# Patient Record
Sex: Female | Born: 1945 | Race: White | Hispanic: No | Marital: Single | Smoking: Never smoker
Health system: Southern US, Community
[De-identification: ages and names within clinical notes are randomized; demographics above are authoritative.]

## PROBLEM LIST (undated history)

## (undated) DIAGNOSIS — F0390 Unspecified dementia without behavioral disturbance: Secondary | ICD-10-CM

## (undated) HISTORY — DX: Unspecified dementia, unspecified severity, without behavioral disturbance, psychotic disturbance, mood disturbance, and anxiety: F03.90

---

## 2014-12-24 ENCOUNTER — Ambulatory Visit: Payer: Medicare Other | Attending: Neurology | Admitting: Speech Pathology

## 2014-12-24 ENCOUNTER — Encounter: Payer: Self-pay | Admitting: Speech Pathology

## 2014-12-24 DIAGNOSIS — R41841 Cognitive communication deficit: Secondary | ICD-10-CM | POA: Insufficient documentation

## 2014-12-24 NOTE — Therapy (Signed)
[facility] [address] [place], [place], [address] Phone: [phone]   Fax:  [phone]  Speech Language Pathology Evaluation  Patient Details  Name: Jeanette Walter MRN: [id number] Date of Birth: 11/04/1945 Referring Provider: Theora MasterZachary Potter, MD  Encounter Date: 12/24/2014      End of Session - 12/24/14 1503    Visit Number 1   Number of Visits 1   Date for SLP Re-Evaluation 12/24/14   SLP Start Time 1100   SLP Stop Time  1200   SLP Time Calculation (min) 60 min   Activity Tolerance Patient tolerated treatment well      Past Medical History  Diagnosis Date  . Dementia     No past surgical history on file.  There were no vitals filed for this visit.  Visit Diagnosis: Cognitive communication deficit - Plan: SLP plan of care cert/re-cert          [facility] 0001    SLP Visit Information   SLP Received On 12/24/14   Referring Provider Theora MasterZachary Potter, MD   Onset Date 12/08/2014   Subjective   Subjective The patient has some word retrieval/language difficulty and says "sometimes it does bother me a little"    General Information   HPI Dementia   Prior Functional Status   Cognitive/Linguistic Baseline Baseline deficits  Diagnosed with early onset dementia several years ago   Oral Motor/Sensory Function   Overall Oral Motor/Sensory Function Appears within functional limits for tasks assessed   Motor Speech   Overall Motor Speech Appears within functional limits for tasks assessed  Slowed rate, fully intelligible   Standardized Assessments   Standardized Assessments  Montreal Cognitive Assessment (MOCA);Western Aphasia Battery revised       Montreal Cognitive Assessment (MOCA)   Version: 7.1   Visuospatial/Executive     Alternating trail making     0/1     Visuoconstruction Skills (copy 3-d design) 1/1     Draw a clock 3/3   Naming 2/3   Attention     Forward digit span  1/1     Backward digit span 0/1     Vigilance 1/1     Serial 7's 2/3   Language     Verbal Fluency 0/1     Repetition 0/2     Abstraction 2/2   Delayed Recall 2/5   Orientation 5/6   TOTAL 19/30    Western Aphasia Battery- Revised Screening   Spontaneous Speech    Information content 9/10    Fluency 9/10   Comprehension     Yes/No questions 9/10      Sequential Commands 8/10   Repetition 8.5/10   Naming     Object Naming 10/10   Aphasia Screening Score 89/100   Reading and Writing     Reading 10/10     Writing 9/10   Language Screening   Score 90.6/100     PATIENT OBSERVATIONS: Patient demonstrated relative strengths such as visuospatial skills, attention, and orientation to the environment, and showed only minor impairment in all areas. She requested use of her own compensatory strategies without prompting, such as bringing out her address and phone number written in her wallet, writing down mathematical figures, rehearsing information to be remembered, and writing with her finger in the air if not permitted a pen. Patient also exhibited effective use of a calendar. Patient's language skills were assessed upon patient complaint of difficulty with word retrieval, but  had a large number of strengths in content, fluency, reading comprehension, reading fluency, object naming, and writing. The patient had good skills in all other areas (auditory verbal comprehension, sequential comands, and repetition) that allowed her to easily complete simple tasks; more abstract tasks involving multiple elements caused her some trouble. The patient demonstrated cognitive-communication skills effective for independent living, especially when allowed to implement her own self-cueing strategies; therefore, the patient is determined to not be in need of cognitive-communication therapy at this current point in time.               SLP Education - 01-13-15 1431    Education Details  Patient encouraged to accept word retrieval problems and to maximize her functional communication by using circumlocution, common knowledge, etc.   Person(s) Educated Patient;Other (comment)   Methods Explanation   Comprehension Verbalized understanding              Plan - January 13, 2015 1504    Clinical Impression Statement Patient is a 69 year old woman under the care of Dr. Malvin Johns, diagnosed with dementia in 2015, is presenting with mild cognitive communication deficits characterized by slowed speech, word finding deficits, memory impairment, and impairment of executive skills.  Per patient and her sister, the patient is managing well and safely in her current setting (independent living in retirement community).  The patient identifies word retrieval problems as frustrating and her sister is concerned that the patient tends to isolate herself socially.  However, the patient is seeking volunteer work at the facility such as helping the Lobbyist.  The patient is functional at this time and does not require speech therapy at this time.   Speech Therapy Frequency One time visit   Treatment/Interventions Other (comment)  evaluation and patient/family education   Potential to Achieve Goals Good   Consulted and Agree with Plan of Care Patient;Family member/caregiver   Family Member Consulted Sister          G-Codes - 13-Jan-2015 1525    Functional Assessment Tool Used MOCA. Western Aphasia Battery- Screenin   Functional Limitations Memory   Memory Current Status [phone]) At least 20 percent but less than 40 percent impaired, limited or restricted   Memory Goal Status (U0454) At least 20 percent but less than 40 percent impaired, limited or restricted   Memory Discharge Status (G9170) At least 20 percent but less than 40 percent impaired, limited or restricted      Problem List There are no active problems to display for this patient.  Jeanette Primrose, MS/CCC- SLP  Leandrew Koyanagi 01/13/15, 3:28 PM  [facility] MAIN [facility] SERVICES [address] [place], [place], [address] Phone: [phone]   Fax:  [phone]  Name: Jeanette Walter MRN: [id number] Date of Birth: 30-Aug-1945

## 2014-12-30 ENCOUNTER — Ambulatory Visit: Payer: Medicare Other | Attending: Neurology

## 2014-12-30 DIAGNOSIS — G4733 Obstructive sleep apnea (adult) (pediatric): Secondary | ICD-10-CM | POA: Diagnosis present

## 2015-01-20 ENCOUNTER — Ambulatory Visit: Payer: Medicare Other | Attending: Neurology

## 2015-01-20 DIAGNOSIS — I10 Essential (primary) hypertension: Secondary | ICD-10-CM | POA: Insufficient documentation

## 2015-01-20 DIAGNOSIS — K219 Gastro-esophageal reflux disease without esophagitis: Secondary | ICD-10-CM | POA: Diagnosis not present

## 2015-01-20 DIAGNOSIS — F519 Sleep disorder not due to a substance or known physiological condition, unspecified: Secondary | ICD-10-CM | POA: Diagnosis not present

## 2015-01-20 DIAGNOSIS — R0683 Snoring: Secondary | ICD-10-CM | POA: Diagnosis not present

## 2015-01-20 DIAGNOSIS — G4733 Obstructive sleep apnea (adult) (pediatric): Secondary | ICD-10-CM | POA: Diagnosis not present

## 2015-01-20 DIAGNOSIS — G4721 Circadian rhythm sleep disorder, delayed sleep phase type: Secondary | ICD-10-CM | POA: Diagnosis not present

## 2015-02-01 ENCOUNTER — Other Ambulatory Visit: Payer: Self-pay | Admitting: Internal Medicine

## 2015-02-01 DIAGNOSIS — Z1231 Encounter for screening mammogram for malignant neoplasm of breast: Secondary | ICD-10-CM

## 2015-02-10 ENCOUNTER — Other Ambulatory Visit: Payer: Self-pay | Admitting: Internal Medicine

## 2015-02-10 ENCOUNTER — Ambulatory Visit
Admission: RE | Admit: 2015-02-10 | Discharge: 2015-02-10 | Disposition: A | Discharge status: Home / Self Care | Payer: Medicare Other | Source: Ambulatory Visit | Attending: Internal Medicine | Admitting: Internal Medicine

## 2015-02-10 DIAGNOSIS — Z1231 Encounter for screening mammogram for malignant neoplasm of breast: Secondary | ICD-10-CM

## 2015-07-10 ENCOUNTER — Other Ambulatory Visit (HOSPITAL_COMMUNITY): Payer: Self-pay | Admitting: Psychiatry

## 2015-08-27 ENCOUNTER — Other Ambulatory Visit (HOSPITAL_COMMUNITY): Payer: Self-pay | Admitting: Psychiatry

## 2015-08-28 ENCOUNTER — Other Ambulatory Visit (HOSPITAL_COMMUNITY): Payer: Self-pay | Admitting: Psychiatry

## 2015-12-06 ENCOUNTER — Other Ambulatory Visit: Payer: Self-pay | Admitting: Otolaryngology

## 2015-12-06 DIAGNOSIS — H9192 Unspecified hearing loss, left ear: Secondary | ICD-10-CM

## 2015-12-07 ENCOUNTER — Telehealth: Payer: Self-pay | Admitting: Otolaryngology

## 2015-12-27 ENCOUNTER — Ambulatory Visit
Admission: RE | Admit: 2015-12-27 | Discharge: 2015-12-27 | Disposition: A | Discharge status: Home / Self Care | Payer: Medicare Other | Source: Ambulatory Visit | Attending: Otolaryngology | Admitting: Otolaryngology

## 2015-12-27 DIAGNOSIS — H9192 Unspecified hearing loss, left ear: Secondary | ICD-10-CM | POA: Diagnosis not present

## 2015-12-27 LAB — POCT I-STAT CREATININE: Creatinine, Ser: 0.8 mg/dL (ref 0.44–1.00)

## 2015-12-27 MED ORDER — GADOBENATE DIMEGLUMINE 529 MG/ML IV SOLN
15.0000 mL | Freq: Once | INTRAVENOUS | Status: AC | PRN
  Administered 2015-12-27: 14 mL via INTRAVENOUS

## 2016-09-28 ENCOUNTER — Other Ambulatory Visit: Payer: Self-pay | Admitting: Orthopedic Surgery

## 2016-09-28 ENCOUNTER — Other Ambulatory Visit: Payer: Self-pay

## 2016-09-28 DIAGNOSIS — Z1231 Encounter for screening mammogram for malignant neoplasm of breast: Secondary | ICD-10-CM

## 2016-10-12 ENCOUNTER — Ambulatory Visit
Admission: RE | Admit: 2016-10-12 | Discharge: 2016-10-12 | Disposition: A | Discharge status: Home / Self Care | Payer: Medicare Other | Source: Ambulatory Visit | Attending: Orthopedic Surgery | Admitting: Orthopedic Surgery

## 2016-10-12 DIAGNOSIS — Z1231 Encounter for screening mammogram for malignant neoplasm of breast: Secondary | ICD-10-CM

## 2017-11-21 ENCOUNTER — Other Ambulatory Visit: Payer: Self-pay | Admitting: Emergency Medicine

## 2017-11-21 DIAGNOSIS — Z1231 Encounter for screening mammogram for malignant neoplasm of breast: Secondary | ICD-10-CM

## 2017-12-18 ENCOUNTER — Ambulatory Visit
Admission: RE | Admit: 2017-12-18 | Discharge: 2017-12-18 | Disposition: A | Discharge status: Home / Self Care | Payer: Medicare Other | Source: Ambulatory Visit | Attending: Emergency Medicine | Admitting: Emergency Medicine

## 2017-12-18 DIAGNOSIS — Z1231 Encounter for screening mammogram for malignant neoplasm of breast: Secondary | ICD-10-CM | POA: Diagnosis not present

## 2018-04-02 ENCOUNTER — Emergency Department: Payer: Medicare Other

## 2018-04-02 ENCOUNTER — Encounter: Payer: Self-pay | Admitting: Emergency Medicine

## 2018-04-02 ENCOUNTER — Other Ambulatory Visit: Payer: Self-pay

## 2018-04-02 ENCOUNTER — Emergency Department
Admission: EM | Admit: 2018-04-02 | Discharge: 2018-04-03 | Disposition: A | Discharge status: Home / Self Care | Payer: Medicare Other | Attending: Emergency Medicine | Admitting: Emergency Medicine

## 2018-04-02 DIAGNOSIS — Z79899 Other long term (current) drug therapy: Secondary | ICD-10-CM | POA: Diagnosis not present

## 2018-04-02 DIAGNOSIS — R05 Cough: Secondary | ICD-10-CM | POA: Diagnosis not present

## 2018-04-02 DIAGNOSIS — R4182 Altered mental status, unspecified: Secondary | ICD-10-CM | POA: Diagnosis present

## 2018-04-02 DIAGNOSIS — Z7982 Long term (current) use of aspirin: Secondary | ICD-10-CM | POA: Diagnosis not present

## 2018-04-02 DIAGNOSIS — F0390 Unspecified dementia without behavioral disturbance: Secondary | ICD-10-CM | POA: Insufficient documentation

## 2018-04-02 DIAGNOSIS — R451 Restlessness and agitation: Secondary | ICD-10-CM | POA: Insufficient documentation

## 2018-04-02 DIAGNOSIS — G3184 Mild cognitive impairment, so stated: Secondary | ICD-10-CM | POA: Diagnosis not present

## 2018-04-02 DIAGNOSIS — R059 Cough, unspecified: Secondary | ICD-10-CM

## 2018-04-02 LAB — COMPREHENSIVE METABOLIC PANEL
ALT: 35 U/L (ref 0–44)
AST: 40 U/L (ref 15–41)
Albumin: 4.1 g/dL (ref 3.5–5.0)
Alkaline Phosphatase: 57 U/L (ref 38–126)
Anion gap: 10 (ref 5–15)
BUN: 19 mg/dL (ref 8–23)
CO2: 25 mmol/L (ref 22–32)
Calcium: 9.7 mg/dL (ref 8.9–10.3)
Chloride: 103 mmol/L (ref 98–111)
Creatinine, Ser: 0.69 mg/dL (ref 0.44–1.00)
GFR calc Af Amer: >60 mL/min (ref >60)
GFR calc non Af Amer: >60 mL/min (ref >60)
Glucose, Bld: 118 mg/dL — ABNORMAL HIGH (ref 70–99)
Potassium: 4.1 mmol/L (ref 3.5–5.1)
SODIUM: 138 mmol/L (ref 135–145)
Total Bilirubin: 0.7 mg/dL (ref 0.3–1.2)
Total Protein: 7.1 g/dL (ref 6.5–8.1)

## 2018-04-02 LAB — URINALYSIS, COMPLETE (UACMP) WITH MICROSCOPIC
Bacteria, UA: NONE SEEN
Bilirubin Urine: NEGATIVE
Glucose, UA: NEGATIVE mg/dL
Hgb urine dipstick: NEGATIVE
KETONES UR: NEGATIVE mg/dL
Nitrite: NEGATIVE
Protein, ur: NEGATIVE mg/dL
Specific Gravity, Urine: 1.02 (ref 1.005–1.030)
pH: 6 (ref 5.0–8.0)

## 2018-04-02 LAB — CBC WITH DIFFERENTIAL/PLATELET
Abs Immature Granulocytes: 0.03 10*3/uL (ref 0.00–0.07)
BASOS ABS: 0.1 10*3/uL (ref 0.0–0.1)
Basophils Relative: 1 %
Eosinophils Absolute: 0.2 10*3/uL (ref 0.0–0.5)
Eosinophils Relative: 2 %
HCT: 38.5 % (ref 36.0–46.0)
Hemoglobin: 13.1 g/dL (ref 12.0–15.0)
IMMATURE GRANULOCYTES: 0 %
Lymphocytes Relative: 15 %
Lymphs Abs: 1.5 10*3/uL (ref 0.7–4.0)
MCH: 29.4 pg (ref 26.0–34.0)
MCHC: 34 g/dL (ref 30.0–36.0)
MCV: 86.5 fL (ref 80.0–100.0)
Monocytes Absolute: 1.3 10*3/uL — ABNORMAL HIGH (ref 0.1–1.0)
Monocytes Relative: 13 %
NEUTROS PCT: 69 %
Neutro Abs: 7.2 10*3/uL (ref 1.7–7.7)
Platelets: 223 10*3/uL (ref 150–400)
RBC: 4.45 MIL/uL (ref 3.87–5.11)
RDW: 13.3 % (ref 11.5–15.5)
WBC: 10.2 10*3/uL (ref 4.0–10.5)
nRBC: 0 % (ref 0.0–0.2)

## 2018-04-02 MED ORDER — ACETAMINOPHEN 500 MG PO TABS
1000.0000 mg | ORAL_TABLET | Freq: Once | ORAL | Status: AC
  Administered 2018-04-02: 1000 mg via ORAL
  Filled 2018-04-02: qty 2

## 2018-04-02 MED ORDER — ATENOLOL 25 MG PO TABS
50.0000 mg | ORAL_TABLET | Freq: Once | ORAL | Status: AC
  Administered 2018-04-02: 50 mg via ORAL
  Filled 2018-04-02: qty 2

## 2018-04-02 MED ORDER — QUETIAPINE FUMARATE 25 MG PO TABS
100.0000 mg | ORAL_TABLET | Freq: Once | ORAL | Status: AC
  Administered 2018-04-02: 100 mg via ORAL
  Filled 2018-04-02: qty 4

## 2018-04-02 MED ORDER — SERTRALINE HCL 50 MG PO TABS
100.0000 mg | ORAL_TABLET | Freq: Once | ORAL | Status: AC
  Administered 2018-04-02: 100 mg via ORAL
  Filled 2018-04-02: qty 2

## 2018-04-02 MED ORDER — AMLODIPINE BESYLATE 5 MG PO TABS
5.0000 mg | ORAL_TABLET | Freq: Once | ORAL | Status: AC
  Administered 2018-04-02: 5 mg via ORAL
  Filled 2018-04-02: qty 1

## 2018-04-02 NOTE — Discharge Instructions (Addendum)
Chest x-ray negative for pneumonia.  Head CT negative.  Urinalysis negative for UTI.  Labs within normal limits.  Patient was given her medications including amlodipine 5 mg, atenolol 50 mg, Seroquel 100 mg, and Zoloft 100 mg prior to transfer back to her independent living facility

## 2018-04-02 NOTE — ED Triage Notes (Signed)
Pt ems from [facility] for AMS. Pt confused on arrival. Refusing care to start more co-operative now

## 2018-04-02 NOTE — ED Notes (Signed)
Attempt to call [facility] at 2 different numbers to secure a ride for patient unsuccessful. Pt states she has no one to call and [facility] can come at 8:30am. Call to EMS

## 2018-04-02 NOTE — ED Notes (Signed)
Patient transported to X-ray 

## 2018-04-02 NOTE — ED Provider Notes (Signed)
[facility] Emergency Department Provider Note  ____________________________________________  Time seen: Approximately 6:08 PM  I have reviewed the triage vital signs and the nursing notes.   HISTORY  Chief Complaint Altered Mental Status  Level 5 caveat:  Portions of the history and physical were unable to be obtained due to dementia and confusion   HPI Jeanette Walter is a 73 y.o. female with a history of dementia and hypertension who presents for evaluation of AMS.  Patient lives in independent living facility.  Today staff went to check on her and she was acting abnormal.  She was very confused, did not know where she was which according to staff is a big change from patient's baseline.  She has had no fever.  Patient is confused at this time and is complaining of a headache.  She will give me any further details on her headache.  She denies chest pain or abdominal pain, nausea, vomiting, diarrhea or dysuria.   Past Medical History:  Diagnosis Date  . Dementia (HCC)   HTN   Prior to Admission medications   Medication Sig Start Date End Date Taking? Authorizing Provider  alendronate (FOSAMAX) 35 MG tablet Take 35 mg by mouth every 7 (seven) days. Take with a full glass of water on an empty stomach.    [provider]  amLODipine (NORVASC) 5 MG tablet Take 5 mg by mouth daily.    [provider]  aspirin 81 MG chewable tablet Chew by mouth daily.    [provider]  atenolol (TENORMIN) 50 MG tablet Take 50 mg by mouth daily.    [provider]  donepezil (ARICEPT ODT) 5 MG disintegrating tablet Take 5 mg by mouth at bedtime.    [provider]  fluticasone (FLONASE) 50 MCG/ACT nasal spray Place into both nostrils daily.    [provider]  loratadine (CLARITIN) 10 MG tablet Take 10 mg by mouth daily.    [provider]  QUEtiapine (SEROQUEL) 200 MG tablet Take 200 mg by mouth at bedtime.     [provider]  sertraline (ZOLOFT) 100 MG tablet Take 100 mg by mouth daily.    [provider]  simvastatin (ZOCOR) 20 MG tablet Take 20 mg by mouth daily.    [provider]    Allergies Septra [sulfamethoxazole-trimethoprim] and Sulfa antibiotics  History reviewed. No pertinent family history.  Social History Social History   Tobacco Use  . Smoking status: Not on file  Substance Use Topics  . Alcohol use: Not on file  . Drug use: Not on file    Review of Systems  Constitutional: Negative for fever. + confusion Cardiovascular: Negative for chest pain. Respiratory: Negative for shortness of breath. Gastrointestinal: Negative for abdominal pain, vomiting or diarrhea. Genitourinary: Negative for dysuria. Neurological: + headaches  ____________________________________________   PHYSICAL EXAM:  VITAL SIGNS: Vitals:   04/02/18 2245 04/02/18 2259  BP:  130/73  Pulse: 76   Resp: 20   Temp:    SpO2: 93%     Constitutional: Alert, disoriented, no distress.  HEENT:      Head: Normocephalic and atraumatic.         Eyes: Conjunctivae are normal. Sclera is non-icteric.       Mouth/Throat: Mucous membranes are moist.       Neck: Supple with no signs of meningismus. Cardiovascular: Regular rate and rhythm. No murmurs, gallops, or rubs. 2+ symmetrical distal pulses are present in all extremities. No JVD.  Respiratory: Normal respiratory effort. Lungs are clear to auscultation bilaterally. No wheezes, crackles, or rhonchi.  Gastrointestinal: Soft, non tender, and non distended with positive bowel sounds. No rebound or guarding. Musculoskeletal: Nontender with normal range of motion in all extremities. No edema, cyanosis, or erythema of extremities. Neurologic: Normal speech and language. Face is symmetric. Moving all extremities. No gross focal neurologic deficits are appreciated. Skin: Skin is warm, dry and intact. No rash noted.   ____________________________________________   LABS (all labs ordered are listed, but only abnormal results are displayed)  Labs Reviewed  CBC WITH DIFFERENTIAL/PLATELET - Abnormal; Notable for the following components:      Result Value   Monocytes Absolute 1.3 (*)    All other components within normal limits  COMPREHENSIVE METABOLIC PANEL - Abnormal; Notable for the following components:   Glucose, Bld 118 (*)    All other components within normal limits  URINALYSIS, COMPLETE (UACMP) WITH MICROSCOPIC - Abnormal; Notable for the following components:   Color, Urine YELLOW (*)    APPearance CLEAR (*)    Leukocytes,Ua TRACE (*)    All other components within normal limits  URINE CULTURE   ____________________________________________  EKG  ED ECG REPORT I, Nita Sickle, the attending physician, personally viewed and interpreted this ECG.  Normal sinus rhythm, rate of 83, normal intervals, normal axis, no ST elevations or depressions.  Normal EKG. ____________________________________________  RADIOLOGY  I have personally reviewed the images performed during this visit and I agree with the Radiologist's read.   Interpretation by Radiologist:  Dg Chest 2 View  Result Date: 04/02/2018 CLINICAL DATA:  Cough EXAM: CHEST - 2 VIEW COMPARISON:  None. FINDINGS: The heart size and mediastinal contours are within normal limits. Both lungs are clear. The visualized skeletal structures are unremarkable. IMPRESSION: No active cardiopulmonary disease. Electronically Signed   By: Deatra Robinson M.D.   On: 04/02/2018 22:10   Ct Head Wo Contrast  Result Date: 04/02/2018 CLINICAL DATA:  Acute onset of confusion. Encephalopathy. EXAM: CT HEAD WITHOUT CONTRAST TECHNIQUE: Contiguous axial images were obtained from the base of the skull through the vertex without intravenous contrast. COMPARISON:  MRI of the brain performed 12/27/2015 FINDINGS: Brain: No evidence of acute infarction, hemorrhage,  hydrocephalus, extra-axial collection or mass lesion / mass effect. Prominence of the sulci suggests mild cortical volume loss. The brainstem and fourth ventricle are within normal limits. The basal ganglia are unremarkable in appearance. The cerebral hemispheres demonstrate grossly normal gray-white differentiation. No mass effect or midline shift is seen. Vascular: No hyperdense vessel or unexpected calcification. Skull: There is no evidence of fracture; visualized osseous structures are unremarkable in appearance. Sinuses/Orbits: The visualized portions of the orbits are within normal limits. The paranasal sinuses and mastoid air cells are well-aerated. Other: No significant soft tissue abnormalities are seen. IMPRESSION: 1. No acute intracranial pathology seen on CT. 2. Mild cortical volume loss noted. Electronically Signed   By: Roanna Raider M.D.   On: 04/02/2018 18:45      ____________________________________________   PROCEDURES  Procedure(s) performed: None Procedures Critical Care performed:  None ____________________________________________   INITIAL IMPRESSION / ASSESSMENT AND PLAN / ED COURSE  73 y.o. female with a history of dementia and hypertension who presents for evaluation of AMS.  Patient is confused but in no distress.  She is otherwise neurologically intact.  Unknown of patient's baseline is however at this time I think she is too confused for somebody who lives in independent living.  She is complaining  of a headache.  She is not on any blood thinners.  No known trauma.  We will send patient for head CT to rule out intracranial hemorrhage.  Will check labs to rule out dehydration, anemia, electrolyte abnormalities, AKI, or UTI.  Will give her Tylenol for her headache.  Will attempt to contact patient's family    _________________________ 11:13 PM on 04/02/2018 -----------------------------------------  Patient is labs, urinalysis, EKG, head CT and chest x-ray showing no  acute findings.  I spoke with patient's sister Jeanette Walter who was able to speak with the patient on the phone and she tells me that patient seems to be currently at baseline.  Patient was noted to have a cough however there is no evidence of pneumonia with a clear chest x-ray, no white count and no fever.  Patient sister called the facility and she was concerned the patient would not be able to get her evening medications since the nurse who administers those had left.  I reassured her that we would give patient her evening medications.  She confirmed the medications on the phone with me.  Patient received her evening doses.  Patient's mental status has improved significantly in the ED, she is able to provide more history at this time, she is pleasant and cooperative.  She reports resolution of her headache after Tylenol was given.  At this time she is stable for discharge back to the facility.  Sister has spoken with the facility who know patient is returning.    As part of my medical decision making, I reviewed the following data within the electronic MEDICAL RECORD NUMBER History obtained from family, Nursing notes reviewed and incorporated, Labs reviewed , EKG interpreted , Old EKG reviewed, Old chart reviewed, Radiograph reviewed , Notes from prior ED visits and [place] Controlled Substance Database    Pertinent labs & imaging results that were available during my care of the patient were reviewed by me and considered in my medical decision making (see chart for details).    ____________________________________________   FINAL CLINICAL IMPRESSION(S) / ED DIAGNOSES  Final diagnoses:  Altered mental status, unspecified altered mental status type  Cough      NEW MEDICATIONS STARTED DURING THIS VISIT:  ED Discharge Orders    None       Note:  This document was prepared using Dragon voice recognition software and may include unintentional dictation errors.    Don Perking, [place], MD  04/02/18 [phone]

## 2018-04-03 ENCOUNTER — Encounter: Payer: Self-pay | Admitting: *Deleted

## 2018-04-03 ENCOUNTER — Other Ambulatory Visit: Payer: Self-pay

## 2018-04-03 ENCOUNTER — Emergency Department (EMERGENCY_DEPARTMENT_HOSPITAL)
Admission: EM | Admit: 2018-04-03 | Discharge: 2018-04-03 | Disposition: A | Discharge status: Home / Self Care | Payer: Medicare Other | Source: Home / Self Care | Attending: Student in an Organized Health Care Education/Training Program | Admitting: Student in an Organized Health Care Education/Training Program

## 2018-04-03 DIAGNOSIS — R4182 Altered mental status, unspecified: Secondary | ICD-10-CM | POA: Diagnosis present

## 2018-04-03 DIAGNOSIS — Z79899 Other long term (current) drug therapy: Secondary | ICD-10-CM | POA: Insufficient documentation

## 2018-04-03 DIAGNOSIS — G3184 Mild cognitive impairment, so stated: Secondary | ICD-10-CM

## 2018-04-03 DIAGNOSIS — R451 Restlessness and agitation: Secondary | ICD-10-CM | POA: Insufficient documentation

## 2018-04-03 DIAGNOSIS — F0391 Unspecified dementia with behavioral disturbance: Secondary | ICD-10-CM

## 2018-04-03 DIAGNOSIS — Z7982 Long term (current) use of aspirin: Secondary | ICD-10-CM | POA: Insufficient documentation

## 2018-04-03 MED ORDER — HALOPERIDOL LACTATE 5 MG/ML IJ SOLN
5.0000 mg | Freq: Once | INTRAMUSCULAR | Status: AC
  Administered 2018-04-03: 5 mg via INTRAMUSCULAR
  Filled 2018-04-03: qty 1

## 2018-04-03 MED ORDER — QUETIAPINE FUMARATE 200 MG PO TABS: 200.0000 mg | ORAL_TABLET | Freq: Every day | ORAL | Status: DC

## 2018-04-03 NOTE — ED Notes (Signed)
TTS  Was unable to engage patient to participate in the assessment.  Patient was unable to answer basic questions at the time of the encounter. Patient was altered and could not provide reliable information. No collateral information was available at this time.

## 2018-04-03 NOTE — Consult Note (Signed)
[facility] Face-to-Face Psychiatry Consult   Reason for Consult: Altered Mental Status Referring Physician: Dr. Scotty Court Patient Identification: Jeanette Walter MRN:  [id number] Principal Diagnosis: Altered mental status, unspecified Diagnosis:  Principal Problem:   Altered mental status, unspecified   Total Time spent with patient: 1 hour  Subjective:  "I'm ready to go home."  Jeanette Walter is a 73 y.o. female patient presented to [facility] ED via EMS. The patient was seen face-to-face by overnight psychiatric provider; chart reviewed. Patient is alert to self.  This provider is unable to engage in the assessment process. During the encounter the patient is unable to answer basic questions. No collateral information was available at that time.   HPI: per Dr. Roxan Hockey; HPI  Jeanette Walter is a 73 y.o. female with advanced dementia presents the ER after recent discharge for worsening dementia agitation and combativeness with EMS professionals. They state that she is currently living in a independent living facility and did not feel comfortable leaving her there.   On reevaluation, patient is calm and cooperative.  She has been sleeping the majority of the morning per nursing report.  She has had no behavioral issues. Collateral was obtained by Robbie Lis (see transition of care note) and interview with the patient's sister Jeanette Walter, also patient's POA.  Jeanette Walter reports that patient has been living independently.  She is able to visit with her sister at least twice a week, and sometimes also spends the night.   Patient has been evaluated by physical therapy and has physical therapy services in place at home.  Due to caregiver burnout, patient would also benefit from social work support in home.  On interview with patient, she is able to discuss coming to the hospital last night for a cough, and then getting agitated when she was returning home "because I was tired."  Patient is currently denying any SI, HI, AVH.   Patient would like to return to her home.  She is agreeable to elaborating services to be brought in.  Sister is agreeable to continuing to maintain patient safety, with support programs put in place by social work from emergency department.   Past Psychiatric History: None given  Risk to Self:  No Risk to Others:  No Prior Inpatient Therapy:  Yes, greater than 10 years ago Prior Outpatient Therapy:  No  Past Medical History:  Past Medical History:  Diagnosis Date  . Dementia (HCC)    History reviewed. No pertinent surgical history. Family History: No family history on file. Family Psychiatric  History: Patient's mother had a progressive dementia similar to what Theres is currently experiencing.  Social History:  Social History   Substance and Sexual Activity  Alcohol Use Not on file     Social History   Substance and Sexual Activity  Drug Use Not on file    Social History   Socioeconomic History  . Marital status: Unknown    Spouse name: Not on file  . Number of children: Not on file  . Years of education: Not on file  . Highest education level: Not on file  Occupational History  . Not on file  Social Needs  . Financial resource strain: Not on file  . Food insecurity:    Worry: Not on file    Inability: Not on file  . Transportation needs:    Medical: Not on file    Non-medical: Not on file  Tobacco Use  . Smoking status: Never Smoker  . Smokeless tobacco: Never Used  Substance and Sexual Activity  . Alcohol use: Not on file  . Drug use: Not on file  . Sexual activity: Not on file  Lifestyle  . Physical activity:    Days per week: Not on file    Minutes per session: Not on file  . Stress: Not on file  Relationships  . Social connections:    Talks on phone: Not on file    Gets together: Not on file    Attends religious service: Not on file    Active member of club or organization: Not on file    Attends meetings of clubs or organizations: Not on file     Relationship status: Not on file  Other Topics Concern  . Not on file  Social History Narrative  . Not on file   Additional Social History:  Living in independent living apartment  Allergies:   Allergies  Allergen Reactions  . Septra [Sulfamethoxazole-Trimethoprim]   . Sulfa Antibiotics     Labs:  Results for orders placed or performed during the hospital encounter of 04/02/18 (from the past 48 hour(s))  CBC with Differential/Platelet     Status: Abnormal   Collection Time: 04/02/18  6:51 PM  Result Value Ref Range   WBC 10.2 4.0 - 10.5 K/uL   RBC 4.45 3.87 - 5.11 MIL/uL   Hemoglobin 13.1 12.0 - 15.0 g/dL   HCT 40.9 81.1 - 91.4 %   MCV 86.5 80.0 - 100.0 fL   MCH 29.4 26.0 - 34.0 pg   MCHC 34.0 30.0 - 36.0 g/dL   RDW 78.2 95.6 - 21.3 %   Platelets 223 150 - 400 K/uL   nRBC 0.0 0.0 - 0.2 %   Neutrophils Relative % 69 %   Neutro Abs 7.2 1.7 - 7.7 K/uL   Lymphocytes Relative 15 %   Lymphs Abs 1.5 0.7 - 4.0 K/uL   Monocytes Relative 13 %   Monocytes Absolute 1.3 (H) 0.1 - 1.0 K/uL   Eosinophils Relative 2 %   Eosinophils Absolute 0.2 0.0 - 0.5 K/uL   Basophils Relative 1 %   Basophils Absolute 0.1 0.0 - 0.1 K/uL   Immature Granulocytes 0 %   Abs Immature Granulocytes 0.03 0.00 - 0.07 K/uL    Comment: Performed at [facility], [address] [place], [place] [address]  Comprehensive metabolic panel     Status: Abnormal   Collection Time: 04/02/18  6:51 PM  Result Value Ref Range   Sodium 138 135 - 145 mmol/L   Potassium 4.1 3.5 - 5.1 mmol/L   Chloride 103 98 - 111 mmol/L   CO2 25 22 - 32 mmol/L   Glucose, Bld 118 (H) 70 - 99 mg/dL   BUN 19 8 - 23 mg/dL   Creatinine, Ser 8.46 0.44 - 1.00 mg/dL   Calcium 9.7 8.9 - 96.2 mg/dL   Total Protein 7.1 6.5 - 8.1 g/dL   Albumin 4.1 3.5 - 5.0 g/dL   AST 40 15 - 41 U/L   ALT 35 0 - 44 U/L   Alkaline Phosphatase 57 38 - 126 U/L   Total Bilirubin 0.7 0.3 - 1.2 mg/dL   GFR calc non Af Amer >60 >60 mL/min   GFR  calc Af Amer >60 >60 mL/min   Anion gap 10 5 - 15    Comment: Performed at [facility], [address] [place], [place] [address]  Urinalysis, Complete w Microscopic     Status: Abnormal   Collection  Time: 04/02/18  8:12 PM  Result Value Ref Range   Color, Urine YELLOW (A) YELLOW   APPearance CLEAR (A) CLEAR   Specific Gravity, Urine 1.020 1.005 - 1.030   pH 6.0 5.0 - 8.0   Glucose, UA NEGATIVE NEGATIVE mg/dL   Hgb urine dipstick NEGATIVE NEGATIVE   Bilirubin Urine NEGATIVE NEGATIVE   Ketones, ur NEGATIVE NEGATIVE mg/dL   Protein, ur NEGATIVE NEGATIVE mg/dL   Nitrite NEGATIVE NEGATIVE   Leukocytes,Ua TRACE (A) NEGATIVE   RBC / HPF 0-5 0 - 5 RBC/hpf   WBC, UA 11-20 0 - 5 WBC/hpf   Bacteria, UA NONE SEEN NONE SEEN   Squamous Epithelial / LPF 0-5 0 - 5   Mucus PRESENT     Comment: Performed at [facility], [address] [place], [place] [address]    Current Facility-Administered Medications  Medication Dose Route Frequency Provider Last Rate Last Dose  . QUEtiapine (SEROQUEL) tablet 200 mg  200 mg Oral QHS Willy Eddy, MD       Current Outpatient Medications  Medication Sig Dispense Refill  . alendronate (FOSAMAX) 35 MG tablet Take 35 mg by mouth every 7 (seven) days. Take with a full glass of water on an empty stomach.    Marland Kitchen amLODipine (NORVASC) 5 MG tablet Take 5 mg by mouth daily.    Marland Kitchen aspirin 81 MG chewable tablet Chew by mouth daily.    Marland Kitchen atenolol (TENORMIN) 50 MG tablet Take 50 mg by mouth daily.    Marland Kitchen donepezil (ARICEPT ODT) 5 MG disintegrating tablet Take 5 mg by mouth at bedtime.    . fluticasone (FLONASE) 50 MCG/ACT nasal spray Place into both nostrils daily.    Marland Kitchen loratadine (CLARITIN) 10 MG tablet Take 10 mg by mouth daily.    . QUEtiapine (SEROQUEL) 200 MG tablet Take 200 mg by mouth at bedtime.    . sertraline (ZOLOFT) 100 MG tablet Take 100 mg by mouth daily.    . simvastatin (ZOCOR) 20 MG tablet Take 20 mg by mouth daily.       Musculoskeletal: Strength & Muscle Tone: decreased Gait & Station: unsteady Patient leans: Backward  Psychiatric Specialty Exam: Physical Exam  Nursing note and vitals reviewed. Constitutional: She appears well-developed and well-nourished.  HENT:  Head: Normocephalic and atraumatic.  Eyes: Pupils are equal, round, and reactive to light. Conjunctivae and EOM are normal.  Neck: Normal range of motion. Neck supple.  Cardiovascular: Normal rate and regular rhythm.  Respiratory: Effort normal and breath sounds normal.  Musculoskeletal: Normal range of motion.  Neurological: She is alert.  Skin: Skin is warm and dry.  Psychiatric: Her behavior is normal.    Review of Systems  Neurological: Positive for tremors and sensory change. Negative for loss of consciousness.  Psychiatric/Behavioral: Positive for memory loss. Negative for depression, hallucinations, substance abuse and suicidal ideas. The patient is not nervous/anxious and does not have insomnia.   All other systems reviewed and are negative.   Blood pressure 132/74, pulse 72, temperature 98.2 F (36.8 C), resp. rate 14, height 5\' 3"  (1.6 m), weight 70.3 kg, SpO2 94 %.Body mass index is 27.45 kg/m.  General Appearance: Fairly Groomed  Eye Contact:  Good  Speech:  Clear and Coherent  Volume:  Decreased  Mood:  Euthymic  Affect:  Flat  Thought Process:  Descriptions of Associations: Circumstantial  Orientation:  Other:  Alert and oriented to self and place  Thought Content:  Logical and Hallucinations: None  Suicidal Thoughts:  No  Homicidal Thoughts:  No  Memory:  impaired  Judgement:  Fair  Insight:  Shallow  Psychomotor Activity:  Normal  Concentration:  Concentration: Fair  Recall:  [employer] of Knowledge:  Fair  Language:  Fair  Akathisia:  No  Handed:  Right  AIMS (if indicated):     Assets:  Housing Social Support  ADL's:  Intact  Cognition:  Impaired,  Mild  Sleep:   adequate     Treatment Plan  Summary: Resume home medications and follow-up with primary care provider for dementia treatment.  Would not recommend antipsychotics at this time, as agitated behavior has not been a chronic problem, and antipsychotics have blackbox warnings in the elderly.  Disposition: No evidence of imminent risk to self or others at present.   Patient does not meet criteria for psychiatric inpatient admission. Supportive therapy provided about ongoing stressors. Discussed crisis plan, support from social network, calling 911, coming to the Emergency Department, and calling Suicide Hotline.   She was able to engage in safety planning including plan to return to nearest emergency room or contact emergency services if she feels unable to maintain her own safety or the safety of others. Patient had no further questions, comments, or concerns.  Discharge into care of sister, who agrees to maintain patient safety.  Mariel Craft, MD

## 2018-04-03 NOTE — ED Notes (Signed)
Pt assisted to the bathroom in hall with RN Connye Burkitt

## 2018-04-03 NOTE — ED Notes (Signed)
Case manger and psychiatrist make plan for pt to be discharged with home health care. Sister was updated by case manger on this plan

## 2018-04-03 NOTE — ED Provider Notes (Signed)
[facility] Emergency Department Provider Note    First MD Initiated Contact with Patient 04/03/18 0107     (approximate)  I have reviewed the triage vital signs and the nursing notes.   HISTORY  Chief Complaint Altered Mental Status  Level V Caveat:  dementia  HPI Jeanette Walter is a 73 y.o. female with advanced dementia presents the ER after recent discharge for worsening dementia agitation and combativeness with EMS professionals.  They state that she is currently living in a independent living facility and did not feel comfortable leaving her there.     Past Medical History:  Diagnosis Date  . Dementia (HCC)    No family history on file. History reviewed. No pertinent surgical history. There are no active problems to display for this patient.     Prior to Admission medications   Medication Sig Start Date End Date Taking? Authorizing Provider  alendronate (FOSAMAX) 35 MG tablet Take 35 mg by mouth every 7 (seven) days. Take with a full glass of water on an empty stomach.    [provider]  amLODipine (NORVASC) 5 MG tablet Take 5 mg by mouth daily.    [provider]  aspirin 81 MG chewable tablet Chew by mouth daily.    [provider]  atenolol (TENORMIN) 50 MG tablet Take 50 mg by mouth daily.    [provider]  donepezil (ARICEPT ODT) 5 MG disintegrating tablet Take 5 mg by mouth at bedtime.    [provider]  fluticasone (FLONASE) 50 MCG/ACT nasal spray Place into both nostrils daily.    [provider]  loratadine (CLARITIN) 10 MG tablet Take 10 mg by mouth daily.    [provider]  QUEtiapine (SEROQUEL) 200 MG tablet Take 200 mg by mouth at bedtime.    [provider]  sertraline (ZOLOFT) 100 MG tablet Take 100 mg by mouth daily.    [provider]  simvastatin (ZOCOR) 20 MG tablet Take 20 mg by mouth daily.    [provider]    Allergies Septra  [sulfamethoxazole-trimethoprim] and Sulfa antibiotics    Social History Social History   Tobacco Use  . Smoking status: Never Smoker  . Smokeless tobacco: Never Used  Substance Use Topics  . Alcohol use: Not on file  . Drug use: Not on file    Review of Systems Patient denies headaches, rhinorrhea, blurry vision, numbness, shortness of breath, chest pain, edema, cough, abdominal pain, nausea, vomiting, diarrhea, dysuria, fevers, rashes or hallucinations unless otherwise stated above in HPI. ____________________________________________   PHYSICAL EXAM:  VITAL SIGNS: Vitals:   04/03/18 0059 04/03/18 0457  BP: (!) 148/88 132/74  Pulse: 84 72  Resp: 16 14  Temp: 98.2 F (36.8 C)   SpO2: 94% 94%    Constitutional: Alert, but disoriented and combative with staff. Eyes: Conjunctivae are normal.  Head: Atraumatic. Nose: No congestion/rhinnorhea. Mouth/Throat: Mucous membranes are moist.   Neck: No stridor. Painless ROM.  Cardiovascular: Normal rate, regular rhythm. Grossly normal heart sounds.  Good peripheral circulation. Respiratory: Normal respiratory effort.  No retractions. Lungs CTAB. Gastrointestinal: Soft and nontender. No distention. No abdominal bruits. No CVA tenderness. Genitourinary:  Musculoskeletal: No lower extremity tenderness nor edema.  No joint effusions. Neurologic:  Normal speech and language. No gross focal neurologic deficits are appreciated. No facial droop Skin:  Skin is warm, dry and intact. No rash noted. Psychiatric: Agitated and appears intermittently paranoid. ____________________________________________   LABS (all labs ordered  are listed, but only abnormal results are displayed)  Results for orders placed or performed during the hospital encounter of 04/02/18 (from the past 24 hour(s))  CBC with Differential/Platelet     Status: Abnormal   Collection Time: 04/02/18  6:51 PM  Result Value Ref Range   WBC 10.2 4.0 - 10.5 K/uL   RBC 4.45  3.87 - 5.11 MIL/uL   Hemoglobin 13.1 12.0 - 15.0 g/dL   HCT 28.4 13.2 - 44.0 %   MCV 86.5 80.0 - 100.0 fL   MCH 29.4 26.0 - 34.0 pg   MCHC 34.0 30.0 - 36.0 g/dL   RDW 10.2 72.5 - 36.6 %   Platelets 223 150 - 400 K/uL   nRBC 0.0 0.0 - 0.2 %   Neutrophils Relative % 69 %   Neutro Abs 7.2 1.7 - 7.7 K/uL   Lymphocytes Relative 15 %   Lymphs Abs 1.5 0.7 - 4.0 K/uL   Monocytes Relative 13 %   Monocytes Absolute 1.3 (H) 0.1 - 1.0 K/uL   Eosinophils Relative 2 %   Eosinophils Absolute 0.2 0.0 - 0.5 K/uL   Basophils Relative 1 %   Basophils Absolute 0.1 0.0 - 0.1 K/uL   Immature Granulocytes 0 %   Abs Immature Granulocytes 0.03 0.00 - 0.07 K/uL  Comprehensive metabolic panel     Status: Abnormal   Collection Time: 04/02/18  6:51 PM  Result Value Ref Range   Sodium 138 135 - 145 mmol/L   Potassium 4.1 3.5 - 5.1 mmol/L   Chloride 103 98 - 111 mmol/L   CO2 25 22 - 32 mmol/L   Glucose, Bld 118 (H) 70 - 99 mg/dL   BUN 19 8 - 23 mg/dL   Creatinine, Ser 4.40 0.44 - 1.00 mg/dL   Calcium 9.7 8.9 - 34.7 mg/dL   Total Protein 7.1 6.5 - 8.1 g/dL   Albumin 4.1 3.5 - 5.0 g/dL   AST 40 15 - 41 U/L   ALT 35 0 - 44 U/L   Alkaline Phosphatase 57 38 - 126 U/L   Total Bilirubin 0.7 0.3 - 1.2 mg/dL   GFR calc non Af Amer >60 >60 mL/min   GFR calc Af Amer >60 >60 mL/min   Anion gap 10 5 - 15  Urinalysis, Complete w Microscopic     Status: Abnormal   Collection Time: 04/02/18  8:12 PM  Result Value Ref Range   Color, Urine YELLOW (A) YELLOW   APPearance CLEAR (A) CLEAR   Specific Gravity, Urine 1.020 1.005 - 1.030   pH 6.0 5.0 - 8.0   Glucose, UA NEGATIVE NEGATIVE mg/dL   Hgb urine dipstick NEGATIVE NEGATIVE   Bilirubin Urine NEGATIVE NEGATIVE   Ketones, ur NEGATIVE NEGATIVE mg/dL   Protein, ur NEGATIVE NEGATIVE mg/dL   Nitrite NEGATIVE NEGATIVE   Leukocytes,Ua TRACE (A) NEGATIVE   RBC / HPF 0-5 0 - 5 RBC/hpf   WBC, UA 11-20 0 - 5 WBC/hpf   Bacteria, UA NONE SEEN NONE SEEN   Squamous  Epithelial / LPF 0-5 0 - 5   Mucus PRESENT    ____________________________________________ ____________________________________________   PROCEDURES  Procedure(s) performed:  Procedures    Critical Care performed: no ____________________________________________   INITIAL IMPRESSION / ASSESSMENT AND PLAN / ED COURSE  Pertinent labs & imaging results that were available during my care of the patient were reviewed by me and considered in my medical decision making (see chart for details).   DDX: dementia, medication noncompliance, sundowning  Jeanette Walter is a 73 y.o. who presents to the ED with symptoms as described above.  Due to her agitation and mild behavior towards staff will give calming agent in the form of IM Haldol.  Patient had extensive work-up in the ER prior to discharge therefore do not feel this needs to be repeated.  Will consult psychiatry.      As part of my medical decision making, I reviewed the following data within the electronic MEDICAL RECORD NUMBER Nursing notes reviewed and incorporated, Labs reviewed, notes from prior ED visits and  Controlled Substance Database   ____________________________________________   FINAL CLINICAL IMPRESSION(S) / ED DIAGNOSES  Final diagnoses:  Agitation  Dementia with behavioral disturbance, unspecified dementia type (HCC)      NEW MEDICATIONS STARTED DURING THIS VISIT:  New Prescriptions   No medications on file     Note:  This document was prepared using Dragon voice recognition software and may include unintentional dictation errors.    Willy Eddy, MD 04/03/18 [phone]

## 2018-04-03 NOTE — TOC Initial Note (Signed)
Transition of Care [facility]) - Initial/Assessment Note    Patient Details  Name: Jeanette Walter MRN: [id number] Date of Birth: [birth year]  Transition [facility]) CM/SW Contact:    Allayne Butcher, RN Phone Number: 04/03/2018, 12:42 PM  Clinical Narrative:                 [facility].  Patient has a history of dementia and patient's sister [name] reports that she has been admitted to a psych unit twice in the past last time 10 years ago.  Myra is the patient's only family and she is overwhelmed with the caretaker responsibility, it is starting to become financially draining.  Home health offered if patient is cleared for discharge and Myra is in agreement with this, she would like to use [facility].  Mellissa Kohut with [facility] aware of possible referral.    Expected Discharge Plan: Home/Self Care([facility] Independent living) Barriers to Discharge: Other (comment)(psych consult)   Patient Goals and CMS Choice Patient states their goals for this hospitalization and ongoing recovery are:: Sister Myra just says she needs help- she is okay with going back to [facility] with home health services. CMS Medicare.gov Compare Post Acute Care list provided to:: Patient Represenative (must comment)(POA Myra pt's sister) Choice offered to / list presented to : Sibling  Expected Discharge Plan and Services Expected Discharge Plan: Home/Self Care([facility] Independent living) Discharge Planning Services: CM Consult   Living arrangements for the past 2 months: Independent Living Facility                          Prior Living Arrangements/Services Living arrangements for the past 2 months: Independent Living Facility Lives with:: Self   Do you feel safe going back to the place where you live?: Yes      Need for Family Participation in Patient Care: Yes (Comment) Care giver support system in place?: Yes (comment) Current home services: Home PT, Homehealth  aide Criminal Activity/Legal Involvement Pertinent to Current Situation/Hospitalization: No - Comment as needed  Activities of Daily Living      Permission Sought/Granted Permission sought to share information with : Other (comment), Case Manager(Home health agency) Permission granted to share information with : Yes, Verbal Permission Granted     Permission granted to share info w AGENCY: [facility]        Emotional Assessment Appearance:: Appears stated age, Well-Groomed Attitude/Demeanor/Rapport: Engaged Affect (typically observed): Accepting, Calm, Appropriate Orientation: : Oriented to Self, Oriented to Place, Oriented to Situation, Oriented to  Time Alcohol / Substance Use: Not Applicable Psych Involvement: Yes (comment)  Admission diagnosis:  altered mental status Patient Active Problem List   Diagnosis Date Noted  . Altered mental status, unspecified 04/03/2018   PCP:  Patient, No Pcp Per Pharmacy:   [id number] [place], [place] - [address] AT [facility] [address] [place] [address] Phone: [phone] Fax: [phone]     Social Determinants of Health (SDOH) Interventions    Readmission Risk Interventions  No flowsheet data found.

## 2018-04-03 NOTE — TOC Transition Note (Signed)
Transition of Care [facility]) - CM/SW Discharge Note   Patient Details  Name: Cassee Thornes MRN: [id number] Date of Birth: 02-14-1945  Transition of Care [facility]) CM/SW Contact:  Allayne Butcher, RN Phone Number: 04/03/2018, 1:08 PM   Clinical Narrative:    Patient will discharge back to [facility] with home health services from [facility].  Sister [name] will pick patient up and transport back to [facility].   Final next level of care: Home w Home Health Services Barriers to Discharge: Barriers Resolved   Patient Goals and CMS Choice Patient states their goals for this hospitalization and ongoing recovery are:: Sister Myra just says she needs help- she is okay with going back to [facility] with home health services. CMS Medicare.gov Compare Post Acute Care list provided to:: Patient Represenative (must comment)(POA Myra pt's sister) Choice offered to / list presented to : Sibling  Discharge Placement                       Discharge Plan and Services Discharge Planning Services: CM Consult                      Social Determinants of Health (SDOH) Interventions     Readmission Risk Interventions No flowsheet data found.

## 2018-04-03 NOTE — ED Provider Notes (Signed)
-----------------------------------------   3:07 PM on 04/03/2018 ----------------------------------------- Remains calm in the ED.  Evaluated by the psychiatry.  They recommend against antipsychotics at this time, no medication changes.  Patient being taken care of by sister.  Arrange for home health services.   Sharman Cheek, MD 04/03/18 [phone]

## 2018-04-03 NOTE — ED Notes (Signed)
Sister; Avon Gully made aware of discharge plan and states she will provide discharge transportation.

## 2018-04-03 NOTE — ED Notes (Signed)
Pt asking if she can go back to her apartment. Explained she needs to have breakfast and see the social worker and from there develop a plan. States she understands but doesn't appear happy or content with the answer

## 2018-04-03 NOTE — ED Triage Notes (Signed)
EMS arrived to bring pt home and when they left she became increasing confused and swinging and kicking at Paramedics. They felt unsafe leaving pt at apartment alone and returned here. Pt acting out and not wanting to move to stretcher. VSS

## 2018-04-04 LAB — URINE CULTURE: Culture: 40000 — AB

## 2018-04-15 ENCOUNTER — Other Ambulatory Visit: Payer: Self-pay

## 2018-04-15 ENCOUNTER — Encounter: Payer: Self-pay | Admitting: Emergency Medicine

## 2018-04-15 ENCOUNTER — Emergency Department
Admission: EM | Admit: 2018-04-15 | Discharge: 2018-06-11 | Disposition: A | Discharge status: Home / Self Care | Payer: Medicare Other | Attending: Emergency Medicine | Admitting: Emergency Medicine

## 2018-04-15 DIAGNOSIS — R2681 Unsteadiness on feet: Secondary | ICD-10-CM | POA: Insufficient documentation

## 2018-04-15 DIAGNOSIS — R410 Disorientation, unspecified: Secondary | ICD-10-CM | POA: Diagnosis not present

## 2018-04-15 DIAGNOSIS — Z20828 Contact with and (suspected) exposure to other viral communicable diseases: Secondary | ICD-10-CM | POA: Diagnosis not present

## 2018-04-15 DIAGNOSIS — F03918 Unspecified dementia, unspecified severity, with other behavioral disturbance: Secondary | ICD-10-CM | POA: Diagnosis present

## 2018-04-15 DIAGNOSIS — F0391 Unspecified dementia with behavioral disturbance: Secondary | ICD-10-CM | POA: Insufficient documentation

## 2018-04-15 DIAGNOSIS — R0602 Shortness of breath: Secondary | ICD-10-CM

## 2018-04-15 DIAGNOSIS — R4182 Altered mental status, unspecified: Secondary | ICD-10-CM | POA: Diagnosis present

## 2018-04-15 DIAGNOSIS — Z7982 Long term (current) use of aspirin: Secondary | ICD-10-CM | POA: Insufficient documentation

## 2018-04-15 DIAGNOSIS — Z046 Encounter for general psychiatric examination, requested by authority: Secondary | ICD-10-CM | POA: Diagnosis present

## 2018-04-15 DIAGNOSIS — Z79899 Other long term (current) drug therapy: Secondary | ICD-10-CM | POA: Diagnosis not present

## 2018-04-15 DIAGNOSIS — F329 Major depressive disorder, single episode, unspecified: Secondary | ICD-10-CM | POA: Insufficient documentation

## 2018-04-15 DIAGNOSIS — R404 Transient alteration of awareness: Secondary | ICD-10-CM | POA: Diagnosis not present

## 2018-04-15 LAB — CBC
HCT: 42.5 % (ref 36.0–46.0)
Hemoglobin: 14.2 g/dL (ref 12.0–15.0)
MCH: 29.2 pg (ref 26.0–34.0)
MCHC: 33.4 g/dL (ref 30.0–36.0)
MCV: 87.3 fL (ref 80.0–100.0)
Platelets: 296 10*3/uL (ref 150–400)
RBC: 4.87 MIL/uL (ref 3.87–5.11)
RDW: 13.4 % (ref 11.5–15.5)
WBC: 10.5 10*3/uL (ref 4.0–10.5)
nRBC: 0 % (ref 0.0–0.2)

## 2018-04-15 LAB — COMPREHENSIVE METABOLIC PANEL
ALBUMIN: 4.4 g/dL (ref 3.5–5.0)
ALT: 43 U/L (ref 0–44)
AST: 51 U/L — ABNORMAL HIGH (ref 15–41)
Alkaline Phosphatase: 58 U/L (ref 38–126)
Anion gap: 12 (ref 5–15)
BUN: 18 mg/dL (ref 8–23)
CO2: 26 mmol/L (ref 22–32)
Calcium: 9.6 mg/dL (ref 8.9–10.3)
Chloride: 104 mmol/L (ref 98–111)
Creatinine, Ser: 0.83 mg/dL (ref 0.44–1.00)
GFR calc Af Amer: >60 mL/min (ref >60)
GFR calc non Af Amer: >60 mL/min (ref >60)
Glucose, Bld: 167 mg/dL — ABNORMAL HIGH (ref 70–99)
Potassium: 3.4 mmol/L — ABNORMAL LOW (ref 3.5–5.1)
Sodium: 142 mmol/L (ref 135–145)
Total Bilirubin: 0.9 mg/dL (ref 0.3–1.2)
Total Protein: 7.6 g/dL (ref 6.5–8.1)

## 2018-04-15 LAB — ETHANOL: Alcohol, Ethyl (B): <10 mg/dL (ref <10)

## 2018-04-15 LAB — ACETAMINOPHEN LEVEL: Acetaminophen (Tylenol), Serum: <10 ug/mL — ABNORMAL LOW (ref 10–30)

## 2018-04-15 LAB — SALICYLATE LEVEL: Salicylate Lvl: <7 mg/dL (ref 2.8–30.0)

## 2018-04-15 MED ORDER — HALOPERIDOL LACTATE 5 MG/ML IJ SOLN
10.0000 mg | Freq: Once | INTRAMUSCULAR | Status: AC
  Administered 2018-04-15: 10 mg via INTRAMUSCULAR
  Filled 2018-04-15: qty 2

## 2018-04-15 MED ORDER — LORAZEPAM 2 MG/ML IJ SOLN
2.0000 mg | Freq: Once | INTRAMUSCULAR | Status: AC
  Administered 2018-04-15: 2 mg via INTRAMUSCULAR
  Filled 2018-04-15: qty 1

## 2018-04-15 NOTE — ED Notes (Signed)
Pt dry at this time / checked by this Clinical research associate and Psychologist, sport and exercise.

## 2018-04-15 NOTE — ED Notes (Signed)
Patient refusing to get undressed at this time , Dr. Mayford Knife notified

## 2018-04-15 NOTE — ED Notes (Addendum)
Informed patient of his impending transfer to [facility].

## 2018-04-15 NOTE — ED Notes (Signed)
Hourly rounding reveals patient in room. No complaints, stable, in no acute distress. Q15 minute rounds and monitoring via Rover and Officer to continue.   

## 2018-04-15 NOTE — ED Notes (Signed)
Sister can be reached at [phone]  [facility].

## 2018-04-15 NOTE — ED Notes (Signed)
Pt uncooperative, will not allow staff to dress her into scrubs.  IM medication given as ordered. Staff to change patient clothing after medication has taken effect.   Pt denies SI. Endorses HI (held fist out to Conservation officer, nature).  Pt difficult to fully assess due to dementia.   Maintained on 15 minute checks and observation by security camera for safety.

## 2018-04-15 NOTE — Consult Note (Signed)
[facility] Face-to-Face Psychiatry Consult   Reason for Consult: Altered mental status, unspecified Referring Physician:   Patient Identification: Jeanette Walter MRN:  [id number] Principal Diagnosis: Altered mental status, unspecified Diagnosis:  Principal Problem:   Altered mental status, unspecified   Total Time spent with patient: 45 minutes  Subjective: " Yes"  Jeanette Walter is a 73 y.o. female patient presented to [facility] ED via EMS.The patient is under involuntarily committed (IVC) status during this ED stay. The patient was seen face-to-face by this provider; chart reviewed and consulted with Dr. Sharma Covert on 04/15/2018 due to the care of the patient. It was discussed with Dr. Sharma Covert,  that the patient does  meet criteria to be admitted to the inpatient Geropsychiatry unit.  On evaluation the patient is alert when aroused, but falls back to sleep.  It has been somewhat difficult to engage the patient doing her assessment process. The patient was medicated earlier today due to her presenting with AMS along with some aggression when she first arrived at the ED.   Collateral was not obtained  by this provider.  Jeanette Walter (sister) [phone] attempts made to speak with her, but not avail.  This provider was not able to leave a message due to the voicemail not confirming it is Ms. Shipp's number. The patient will remain under IVC status until transfer to a facility. The patient last hospitalization was April 03, 2018 and she was seen by this provider and by Dr. Viviano Simas.   Per Dr. Joycelyn Das home medications and follow-up with primary care provider for dementia treatment.  Would not recommend antipsychotics at this time, as agitated behavior has not been a chronic problem, and antipsychotics have blackbox warnings in the elderly.  Plan:  The patient is a safety risk to herself due to her AMS along with her aggressive behaviors. She needs inpatient for stabilization and treatment. The patient should  remain on all current psychiatric medications.  HPI:  Per Dr. Mayford Knife; Starr Gazda is a 73 y.o. female with a history of dementia who presents to the ED for aggressive and combative behavior.  Reportedly the patient is from [facility] with involuntary commitment paperwork. She was found trying to run away from her facility.  She has a history of dementia and would not answer questions appropriately.  She has been at times combative.  Her facility has been on lockdown for a suspected coronavirus patient which has agitated her.  She does not have any respiratory symptoms.  Past Psychiatric History:  Dementia (HCC) Altered mental status, unspecified  Risk to Self:  No Risk to Others:  No Prior Inpatient Therapy:  Yes Prior Outpatient Therapy:  Yes  Past Medical History:  Past Medical History:  Diagnosis Date  . Dementia (HCC)    History reviewed. No pertinent surgical history. Family History: No family history on file. Family Psychiatric  History: History reviewed. No pertinent family history Social History:  Social History   Substance and Sexual Activity  Alcohol Use Not on file     Social History   Substance and Sexual Activity  Drug Use Not on file    Social History   Socioeconomic History  . Marital status: Single    Spouse name: Not on file  . Number of children: Not on file  . Years of education: Not on file  . Highest education level: Not on file  Occupational History  . Not on file  Social Needs  . Financial resource strain: Not on file  .  Food insecurity:    Worry: Not on file    Inability: Not on file  . Transportation needs:    Medical: Not on file    Non-medical: Not on file  Tobacco Use  . Smoking status: Never Smoker  . Smokeless tobacco: Never Used  Substance and Sexual Activity  . Alcohol use: Not on file  . Drug use: Not on file  . Sexual activity: Not on file  Lifestyle  . Physical activity:    Days per week: Not on file    Minutes per  session: Not on file  . Stress: Not on file  Relationships  . Social connections:    Talks on phone: Not on file    Gets together: Not on file    Attends religious service: Not on file    Active member of club or organization: Not on file    Attends meetings of clubs or organizations: Not on file    Relationship status: Not on file  Other Topics Concern  . Not on file  Social History Narrative  . Not on file   Additional Social History:    Allergies:   Allergies  Allergen Reactions  . Septra [Sulfamethoxazole-Trimethoprim]   . Sulfa Antibiotics     Labs:  Results for orders placed or performed during the hospital encounter of 04/15/18 (from the past 48 hour(s))  Comprehensive metabolic panel     Status: Abnormal   Collection Time: 04/15/18  1:55 PM  Result Value Ref Range   Sodium 142 135 - 145 mmol/L   Potassium 3.4 (L) 3.5 - 5.1 mmol/L   Chloride 104 98 - 111 mmol/L   CO2 26 22 - 32 mmol/L   Glucose, Bld 167 (H) 70 - 99 mg/dL   BUN 18 8 - 23 mg/dL   Creatinine, Ser 4.81 0.44 - 1.00 mg/dL   Calcium 9.6 8.9 - 85.6 mg/dL   Total Protein 7.6 6.5 - 8.1 g/dL   Albumin 4.4 3.5 - 5.0 g/dL   AST 51 (H) 15 - 41 U/L   ALT 43 0 - 44 U/L   Alkaline Phosphatase 58 38 - 126 U/L   Total Bilirubin 0.9 0.3 - 1.2 mg/dL   GFR calc non Af Amer >60 >60 mL/min   GFR calc Af Amer >60 >60 mL/min   Anion gap 12 5 - 15    Comment: Performed at [facility], [address] [place], [place] [address]  Ethanol     Status: None   Collection Time: 04/15/18  1:55 PM  Result Value Ref Range   Alcohol, Ethyl (B) <10 <10 mg/dL    Comment: (NOTE) Lowest detectable limit for serum alcohol is 10 mg/dL. For medical purposes only. Performed at [facility], [address] [place], [place] [address]   Salicylate level     Status: None   Collection Time: 04/15/18  1:55 PM  Result Value Ref Range   Salicylate Lvl <7.0 2.8 - 30.0 mg/dL    Comment: Performed at [facility], [address] [place], [place] [address]  Acetaminophen level     Status: Abnormal   Collection Time: 04/15/18  1:55 PM  Result Value Ref Range   Acetaminophen (Tylenol), Serum <10 (L) 10 - 30 ug/mL    Comment: (NOTE) Therapeutic concentrations vary significantly. A range of 10-30 ug/mL  may be an effective concentration for many patients. However, some  are best treated at concentrations outside of this range. Acetaminophen concentrations >150  ug/mL at 4 hours after ingestion  and >50 ug/mL at 12 hours after ingestion are often associated with  toxic reactions. Performed at [facility], [address] [place], [place] [address]   cbc     Status: None   Collection Time: 04/15/18  1:55 PM  Result Value Ref Range   WBC 10.5 4.0 - 10.5 K/uL   RBC 4.87 3.87 - 5.11 MIL/uL   Hemoglobin 14.2 12.0 - 15.0 g/dL   HCT 60.4 54.0 - 98.1 %   MCV 87.3 80.0 - 100.0 fL   MCH 29.2 26.0 - 34.0 pg   MCHC 33.4 30.0 - 36.0 g/dL   RDW 19.1 47.8 - 29.5 %   Platelets 296 150 - 400 K/uL   nRBC 0.0 0.0 - 0.2 %    Comment: Performed at [facility] [address] [place], [place] [address]    No current facility-administered medications for this encounter.    Current Outpatient Medications  Medication Sig Dispense Refill  . alendronate (FOSAMAX) 35 MG tablet Take 35 mg by mouth every 7 (seven) days. Take with a full glass of water on an empty stomach.    Marland Kitchen amLODipine (NORVASC) 5 MG tablet Take 5 mg by mouth daily.    Marland Kitchen aspirin 81 MG chewable tablet Chew by mouth daily.    Marland Kitchen atenolol (TENORMIN) 50 MG tablet Take 50 mg by mouth daily.    Marland Kitchen donepezil (ARICEPT ODT) 5 MG disintegrating tablet Take 5 mg by mouth at bedtime.    . fluticasone (FLONASE) 50 MCG/ACT nasal spray Place into both nostrils daily.    Marland Kitchen loratadine (CLARITIN) 10 MG tablet Take 10 mg by mouth daily.    . QUEtiapine (SEROQUEL) 200 MG tablet Take 200 mg by mouth at bedtime.    . sertraline (ZOLOFT)  100 MG tablet Take 100 mg by mouth daily.    . simvastatin (ZOCOR) 20 MG tablet Take 20 mg by mouth daily.      Musculoskeletal: Strength & Muscle Tone: decreased Gait & Station: unsteady Patient leans: N/A  Psychiatric Specialty Exam: Physical Exam  Nursing note and vitals reviewed. Constitutional: She appears well-developed and well-nourished.  Eyes: Pupils are equal, round, and reactive to light. Conjunctivae and EOM are normal.  Neck: Normal range of motion. Neck supple.  Cardiovascular: Normal rate and regular rhythm.  Respiratory: Effort normal and breath sounds normal.  Musculoskeletal: Normal range of motion.  Neurological: She is alert.  Skin: Skin is warm and dry.    Review of Systems  Psychiatric/Behavioral: Positive for depression and memory loss. Negative for hallucinations, substance abuse and suicidal ideas. The patient is nervous/anxious.   All other systems reviewed and are negative.   Blood pressure (!) 116/49, pulse 69, temperature 98.5 F (36.9 C), temperature source Oral, SpO2 97 %.There is no height or weight on file to calculate BMI.  General Appearance: Fairly Groomed  Eye Contact:  None  Speech:  Garbled  Volume:  Decreased  Mood:  NA  Affect:  NA  Thought Process:  NA  Orientation:  Other:  Self  Thought Content:  NA  Suicidal Thoughts:  Unable to  Assess  Homicidal Thoughts:  Unable to  Assess  Memory:  Unable to  Assess  Judgement:  Other:  Unable to  Assess  Insight:  Lacking  Psychomotor Activity:  Decreased and Tremor  Concentration:  Concentration: Poor  Recall:  Poor  Fund of Knowledge:  Poor  Language:  Good  Akathisia:  NA  Handed:  Right  AIMS (if indicated):     Assets:  Desire for Improvement Housing Social Support  ADL's:  Impaired  Cognition:  Impaired,  Severe  Sleep:    Unable to Assess     Treatment Plan Summary: Daily contact with patient to assess and evaluate symptoms and progress in treatment and Medication  management  Disposition: Supportive therapy provided about ongoing stressors.  Patient does meet criteria for psychiatric inpatient admission  Catalina Gravel, NP 04/15/2018 8:47 PM

## 2018-04-15 NOTE — ED Notes (Signed)
Hourly rounding reveals patient sleeping in room. No complaints, stable, in no acute distress. Q15 minute rounds and monitoring via Rover and Officer to continue.  

## 2018-04-15 NOTE — ED Provider Notes (Signed)
[facility] Emergency Department Provider Note       Time seen: ----------------------------------------- 2:20 PM on 04/15/2018 ----------------------------------------- Level V caveat: History/ROS limited by dementia  I have reviewed the triage vital signs and the nursing notes.  HISTORY   Chief Complaint No chief complaint on file.    HPI Jeanette Walter is a 73 y.o. female with a history of dementia who presents to the ED for aggressive and combative behavior.  Reportedly the patient is from [facility] with involuntary commitment paperwork.  She was found trying to run away from her facility.  She has a history of dementia and would not answer questions appropriately.  She has been at times combative.  Her facility has been on lockdown for a suspected coronavirus patient which has agitated her.  She does not have any respiratory symptoms.  Past Medical History:  Diagnosis Date  . Dementia [facility])     Patient Active Problem List   Diagnosis Date Noted  . Altered mental status, unspecified 04/03/2018    History reviewed. No pertinent surgical history.  Allergies Septra [sulfamethoxazole-trimethoprim] and Sulfa antibiotics  Social History Social History   Tobacco Use  . Smoking status: Never Smoker  . Smokeless tobacco: Never Used  Substance Use Topics  . Alcohol use: Not on file  . Drug use: Not on file   Review of Systems Reported aggressive and combative behavior  All systems negative/normal/unremarkable except as stated in the HPI  ____________________________________________   PHYSICAL EXAM:  VITAL SIGNS: ED Triage Vitals  Enc Vitals Group     BP --      Pulse Rate 04/15/18 1355 71     Resp --      Temp 04/15/18 1355 97.6 F (36.4 C)     Temp Source 04/15/18 1355 Axillary     SpO2 04/15/18 1355 97 %     Weight --      Height --      Head Circumference --      Peak Flow --      Pain Score 04/15/18 1350 0     Pain Loc --       Pain Edu? --      Excl. in GC? --    Constitutional: Alert and oriented. Well appearing and in no distress. Cardiovascular: Normal rate, regular rhythm. No murmurs, rubs, or gallops. Respiratory: Normal respiratory effort without tachypnea nor retractions. Breath sounds are clear and equal bilaterally. No wheezes/rales/rhonchi. Gastrointestinal: Soft and nontender. Normal bowel sounds Musculoskeletal: Nontender with normal range of motion in extremities. No lower extremity tenderness nor edema. Neurologic:  Normal speech and language. No gross focal neurologic deficits are appreciated.  Skin:  Skin is warm, dry and intact. No rash noted. Psychiatric: Agitated mood and affect at times. ____________________________________________  ED COURSE:  As part of my medical decision making, I reviewed the following data within the electronic MEDICAL RECORD NUMBER History obtained from family if available, nursing notes, old chart and ekg, as well as notes from prior ED visits. Patient presented for aggressive behavior in the setting of chronic dementia, we will assess with labs and imaging as indicated at this time.   Procedures ____________________________________________   LABS (pertinent positives/negatives)  Labs Reviewed  COMPREHENSIVE METABOLIC PANEL - Abnormal; Notable for the following components:      Result Value   Potassium 3.4 (*)    Glucose, Bld 167 (*)    AST 51 (*)    All other components within normal limits  ETHANOL  CBC  SALICYLATE LEVEL  ACETAMINOPHEN LEVEL  URINE DRUG SCREEN, QUALITATIVE ([facility] ONLY)  URINALYSIS, COMPLETE ([facility]) WITH MICROSCOPIC   ____________________________________________   DIFFERENTIAL DIAGNOSIS   Dementia, occult infection, medication noncompliance  FINAL ASSESSMENT AND PLAN  Dementia with behavioral disturbance   Plan: The patient had presented for aggression. Patient's labs so far unremarkable, urinalysis is pending.  Overall she  appears cleared for psychiatric evaluation and disposition.   Ulice Dash, MD    Note: This note was generated in part or whole with voice recognition software. Voice recognition is usually quite accurate but there are transcription errors that can and very often do occur. I apologize for any typographical errors that were not detected and corrected.     Emily Filbert, MD 04/15/18 [phone]

## 2018-04-15 NOTE — ED Notes (Signed)
Per officer staff told law enforcement that there was a + covid case in the facility and pt may need to be tested". PT has no symptoms noted in triage, RR even and unlabored. No cough noted . PT refuses to keep on face mask

## 2018-04-15 NOTE — ED Notes (Addendum)
Pt belongings included: White pants White underwear Gray socks Flowered shirt Red sweater [employer] watch Nasal spray  1 bag

## 2018-04-15 NOTE — ED Triage Notes (Addendum)
PT from [facility] with IVC paperwork, pt was found trying running away from facility. Pt hx of dementia, will not answer questions appropriately. PT is at times combative. Per officer was told that since pt was told facility was on lock down pt has been combative with other residents.

## 2018-04-15 NOTE — TOC Initial Note (Signed)
Transition of Care [facility]) - Initial/Assessment Note    Patient Details  Name: Jeanette Walter MRN: [id number] Date of Birth: 11-15-1945  Transition of Care [facility]) CM/SW Contact:    Allayne Butcher, RN Phone Number: 04/15/2018, 2:33 PM  Clinical Narrative:                 Patient being seen in the ED for aggressive combative behavior at her assisted living facility [facility].  Patient accompanied to the ED by [facility] department for IVC.  Patient's sister [name] is here at the hospital in the ED waiting area- RNCM notified Myra that no visitors are allowed, she reports EMS told her to come here to offer medical history.  Myra contacted this RNCM about patient coming back to the ED- Myra reports that she will not take the patient home with her.  RNCM will follow.   Expected Discharge Plan: Psychiatric Hospital     Patient Goals and CMS Choice        Expected Discharge Plan and Services Expected Discharge Plan: Psychiatric Hospital In-house Referral: Clinical Social Work Discharge Planning Services: CM Consult   Living arrangements for the past 2 months: Assisted Living Facility([facility])                          Prior Living Arrangements/Services Living arrangements for the past 2 months: Assisted Living Facility([facility]) Lives with:: Facility Resident Patient language and need for interpreter reviewed:: No        Need for Family Participation in Patient Care: Yes (Comment)(sister) Care giver support system in place?: Yes (comment)(sister is there but unable to provide care for the patient) Current home services: Homehealth aide, Home RN, Home PT(Home Health with [facility]) Criminal Activity/Legal Involvement Pertinent to Current Situation/Hospitalization: No - Comment as needed  Activities of Daily Living      Permission Sought/Granted Permission sought to share information with : Facility Industrial/product designer granted to share information with :  Yes, Verbal Permission Granted        Permission granted to share info w Relationship: Sister Myra     Emotional Assessment Appearance:: Appears stated age Attitude/Demeanor/Rapport: Uncooperative, Aggressive (Verbally and/or physically) Affect (typically observed): Inappropriate Orientation: : (Patient has dementia) Alcohol / Substance Use: Not Applicable Psych Involvement: Yes (comment)  Admission diagnosis:  beh med eval Patient Active Problem List   Diagnosis Date Noted  . Altered mental status, unspecified 04/03/2018   PCP:  Patient, No Pcp Per Pharmacy:   [id number] [place] Rough, [place] - [address] AT [facility] [address] [place] [address] Phone: [phone] Fax: [phone]     Social Determinants of Health (SDOH) Interventions    Readmission Risk Interventions No flowsheet data found.

## 2018-04-15 NOTE — ED Notes (Signed)
Report to include Situation, Background, Assessment, and Recommendations received from RN. Patient alert and oriented, warm and dry, in no acute distress. Patient refuses to answer questions related to SI, HI, AVH and pain. Patient made aware of Q15 minute rounds and Psychologist, counselling presence for their safety. Patient instructed to come to me with needs or concerns.

## 2018-04-15 NOTE — ED Notes (Signed)
RN Amy, Tech MG and Brittney changed patient out. Pt is sleeping with no complaints.

## 2018-04-15 NOTE — ED Notes (Signed)
Hourly rounding reveals patient sleeping in room. No complaints, stable, in no acute distress. Q15 minute rounds and monitoring via Security Cameras to continue. 

## 2018-04-15 NOTE — ED Notes (Addendum)
Hourly rounding reveals patient sleeping in room. No complaints, stable, in no acute distress. Q15 minute rounds and monitoring via Rover and Officer to continue.  

## 2018-04-16 ENCOUNTER — Emergency Department: Payer: Medicare Other

## 2018-04-16 DIAGNOSIS — F0391 Unspecified dementia with behavioral disturbance: Secondary | ICD-10-CM | POA: Diagnosis present

## 2018-04-16 DIAGNOSIS — F03918 Unspecified dementia, unspecified severity, with other behavioral disturbance: Secondary | ICD-10-CM | POA: Diagnosis present

## 2018-04-16 MED ORDER — QUETIAPINE FUMARATE 200 MG PO TABS
200.0000 mg | ORAL_TABLET | Freq: Every day | ORAL | Status: DC
  Administered 2018-04-16 – 2018-04-18 (×2): 200 mg via ORAL
  Filled 2018-04-16 (×2): qty 1

## 2018-04-16 MED ORDER — HALOPERIDOL LACTATE 5 MG/ML IJ SOLN
5.0000 mg | Freq: Once | INTRAMUSCULAR | Status: DC
  Filled 2018-04-16: qty 1

## 2018-04-16 MED ORDER — SIMVASTATIN 10 MG PO TABS
20.0000 mg | ORAL_TABLET | Freq: Every day | ORAL | Status: DC
  Administered 2018-04-17 – 2018-06-11 (×29): 20 mg via ORAL
  Filled 2018-04-16 (×37): qty 2

## 2018-04-16 MED ORDER — AMLODIPINE BESYLATE 5 MG PO TABS
5.0000 mg | ORAL_TABLET | Freq: Every day | ORAL | Status: DC
  Administered 2018-04-17 – 2018-06-03 (×22): 5 mg via ORAL
  Filled 2018-04-16 (×35): qty 1

## 2018-04-16 MED ORDER — FLUTICASONE PROPIONATE 50 MCG/ACT NA SUSP
1.0000 | Freq: Every day | NASAL | Status: DC
  Administered 2018-04-17 – 2018-05-24 (×8): 1 via NASAL
  Filled 2018-04-16 (×2): qty 16

## 2018-04-16 MED ORDER — LORATADINE 10 MG PO TABS
10.0000 mg | ORAL_TABLET | Freq: Every day | ORAL | Status: DC
  Administered 2018-04-17 – 2018-06-01 (×25): 10 mg via ORAL
  Filled 2018-04-16 (×30): qty 1

## 2018-04-16 MED ORDER — LORAZEPAM 2 MG/ML IJ SOLN
2.0000 mg | Freq: Once | INTRAMUSCULAR | Status: DC
  Filled 2018-04-16: qty 1

## 2018-04-16 MED ORDER — ASPIRIN 81 MG PO CHEW
81.0000 mg | CHEWABLE_TABLET | Freq: Every day | ORAL | Status: DC
  Administered 2018-04-17 – 2018-06-11 (×30): 81 mg via ORAL
  Filled 2018-04-16 (×38): qty 1

## 2018-04-16 MED ORDER — SERTRALINE HCL 50 MG PO TABS
100.0000 mg | ORAL_TABLET | Freq: Every day | ORAL | Status: DC
  Administered 2018-04-17 – 2018-06-11 (×29): 100 mg via ORAL
  Filled 2018-04-16 (×39): qty 2

## 2018-04-16 MED ORDER — ATENOLOL 25 MG PO TABS
50.0000 mg | ORAL_TABLET | Freq: Every day | ORAL | Status: DC
  Administered 2018-04-17 – 2018-06-03 (×22): 50 mg via ORAL
  Filled 2018-04-16 (×35): qty 2

## 2018-04-16 MED ORDER — DONEPEZIL HCL 5 MG PO TABS
5.0000 mg | ORAL_TABLET | Freq: Every day | ORAL | Status: DC
  Administered 2018-04-16 – 2018-06-10 (×48): 5 mg via ORAL
  Filled 2018-04-16 (×50): qty 1

## 2018-04-16 NOTE — ED Notes (Signed)
[facility] [phone]..needs chest xray

## 2018-04-16 NOTE — ED Notes (Signed)
Hourly rounding reveals patient sleeping in room. No complaints, stable, in no acute distress. Q15 minute rounds and monitoring via Rover and Officer to continue.  

## 2018-04-16 NOTE — BH Assessment (Addendum)
Pt is under review with [facility]. This Clinical research associate spoke with Morrie Sheldon (Intake RN) who requested a chest x-ray, EKG, urinalysis, and a UDS.  This information was relayed to pt's nurse and Dr. Viviano Simas.

## 2018-04-16 NOTE — ED Notes (Signed)
Pt. Believes she is still at [facility].  Pt. Told "You are at the hospital in [place], this is a safe place until we find a safe place to live".  Pt. Asked "What floor are we on?, I need to go to lower floor."  Pt. Appears calm at this time, just confused.  Pt. reasurred that she is in a safe place.

## 2018-04-16 NOTE — BH Assessment (Signed)
Pt faxed out for inpatient treatment:   [facility]       [facility]     [facility]     [facility]     [facility]     [facility]     [facility]     [facility]     [facility]     [facility]     [facility]    [facility]     [facility]     [facility]     [facility]

## 2018-04-16 NOTE — ED Notes (Signed)
Hourly rounding reveals patient sleeping in room. No complaints, stable, in no acute distress. Q15 minute rounds and monitoring via Security Cameras to continue. 

## 2018-04-16 NOTE — ED Notes (Signed)
Patient refused all medications, attempted to give applesauce with medication patient refused it also. Patient kept mouth tightly closed

## 2018-04-16 NOTE — ED Provider Notes (Addendum)
-----------------------------------------   4:29 AM on 04/16/2018 -----------------------------------------   Blood pressure 101/66, pulse 78, temperature 98.3 F (36.8 C), temperature source Oral, resp. rate 18, SpO2 96 %.  The patient is calm and cooperative at this time.  There have been no acute events since the last update.  Awaiting geropsychiatric placement.  I ordered her daily meds.   Loleta Rose, MD 04/16/18 0430

## 2018-04-16 NOTE — ED Notes (Signed)
Patient came out of her room, and she would not go back into her room when asked. Patient refused and said no.  Patient was escorted back to room with 2 people assist.

## 2018-04-16 NOTE — ED Notes (Signed)
Hourly rounding reveals patient sleeping in room. No complaints, stable, in no acute distress. Q15 minute rounds and monitoring via Security to continue. 

## 2018-04-17 LAB — URINALYSIS, COMPLETE (UACMP) WITH MICROSCOPIC
Bacteria, UA: NONE SEEN
Bilirubin Urine: NEGATIVE
Glucose, UA: NEGATIVE mg/dL
Hgb urine dipstick: NEGATIVE
Ketones, ur: NEGATIVE mg/dL
Leukocytes,Ua: NEGATIVE
Nitrite: NEGATIVE
PH: 6 (ref 5.0–8.0)
Protein, ur: NEGATIVE mg/dL
Specific Gravity, Urine: 1.024 (ref 1.005–1.030)

## 2018-04-17 LAB — URINE DRUG SCREEN, QUALITATIVE (ARMC ONLY)
Amphetamines, Ur Screen: NOT DETECTED
Barbiturates, Ur Screen: NOT DETECTED
Benzodiazepine, Ur Scrn: POSITIVE — AB
Cannabinoid 50 Ng, Ur ~~LOC~~: NOT DETECTED
Cocaine Metabolite,Ur ~~LOC~~: NOT DETECTED
MDMA (Ecstasy)Ur Screen: NOT DETECTED
Methadone Scn, Ur: NOT DETECTED
Opiate, Ur Screen: NOT DETECTED
Phencyclidine (PCP) Ur S: NOT DETECTED
TRICYCLIC, UR SCREEN: POSITIVE — AB

## 2018-04-17 NOTE — ED Notes (Signed)
Pt up and asking staff, "When can I go home.  What's going on?  When can I get out of here?"  Pt re-oriented to place and time.  Pt returned to her bed.

## 2018-04-17 NOTE — ED Provider Notes (Signed)
-----------------------------------------   5:54 AM on 04/17/2018 -----------------------------------------   Blood pressure 125/69, pulse 81, temperature 98.6 F (37 C), temperature source Oral, resp. rate 18, SpO2 95 %.  The patient is sleeping at this time.  There have been no acute events since the last update.  Awaiting geropsychiatric placement.   Irean Hong, MD 04/17/18 [phone]

## 2018-04-17 NOTE — BH Assessment (Signed)
Writer followed up with referrals   [facility], no beds                                               [facility] [phone]), pending review                     [facility] [phone] or [phone]),                         [facility]-(Jassmine-[phone]), declined due medical concerns   [address] (Joeanne-[phone]), Pending review.                         Charma Igo [phone] ex. [phone])                         [facility]                         [facility] [phone]) declined due demenita                         [facility] [phone]), pending review                 [facility]                         [facility]                         [facility]                         [facility]                    [facility] [phone])                         [facility] Browner                       [facility]                    [facility] [phone]), declined

## 2018-04-17 NOTE — ED Notes (Signed)
Patient refused lunch at this time

## 2018-04-17 NOTE — ED Notes (Signed)
Patient refused breakfast at this time

## 2018-04-17 NOTE — ED Notes (Signed)
Clean catch urine sample sent to lab.

## 2018-04-17 NOTE — ED Provider Notes (Signed)
Pt denied by [facility] to to possible exposure to [employer]

## 2018-04-18 ENCOUNTER — Emergency Department: Payer: Medicare Other

## 2018-04-18 MED ORDER — ENSURE ENLIVE PO LIQD
237.0000 mL | Freq: Two times a day (BID) | ORAL | Status: DC
  Administered 2018-04-18 – 2018-06-07 (×36): 237 mL via ORAL

## 2018-04-18 MED ORDER — LORAZEPAM 0.5 MG PO TABS
0.5000 mg | ORAL_TABLET | Freq: Two times a day (BID) | ORAL | Status: DC | PRN
  Administered 2018-04-20 – 2018-06-05 (×7): 0.5 mg via ORAL
  Filled 2018-04-18 (×9): qty 1

## 2018-04-18 MED ORDER — LORAZEPAM 2 MG/ML IJ SOLN
0.5000 mg | Freq: Two times a day (BID) | INTRAMUSCULAR | Status: DC | PRN
  Administered 2018-04-19 – 2018-06-10 (×9): 0.5 mg via INTRAMUSCULAR
  Filled 2018-04-18 (×10): qty 1

## 2018-04-18 MED ORDER — DIVALPROEX SODIUM 125 MG PO CSDR
250.0000 mg | DELAYED_RELEASE_CAPSULE | Freq: Two times a day (BID) | ORAL | Status: DC
  Administered 2018-04-18 – 2018-04-24 (×7): 250 mg via ORAL
  Filled 2018-04-18 (×14): qty 2

## 2018-04-18 MED ORDER — DIVALPROEX SODIUM 125 MG PO CSDR
250.0000 mg | DELAYED_RELEASE_CAPSULE | Freq: Two times a day (BID) | ORAL | Status: DC
  Filled 2018-04-18: qty 2

## 2018-04-18 MED ORDER — QUETIAPINE FUMARATE 25 MG PO TABS
100.0000 mg | ORAL_TABLET | Freq: Every day | ORAL | Status: DC
  Administered 2018-04-18 – 2018-04-19 (×2): 100 mg via ORAL
  Filled 2018-04-18 (×2): qty 4

## 2018-04-18 MED ORDER — DIVALPROEX SODIUM 125 MG PO CSDR
125.0000 mg | DELAYED_RELEASE_CAPSULE | Freq: Two times a day (BID) | ORAL | Status: DC
  Filled 2018-04-18 (×2): qty 1

## 2018-04-18 NOTE — ED Notes (Signed)
Pt. Using bed side commode.  Pt. Having bowel movement.  Pt. Has two techs assisting patient with using bathroom.

## 2018-04-18 NOTE — Consult Note (Signed)
[facility] Face-to-Face Psychiatry Consult   Reason for Consult: Altered mental status Referring Physician: [facility] ED MD Patient Identification: Jeanette Walter MRN:  [id number] Principal Diagnosis: Altered mental status, unspecified Diagnosis:  Principal Problem:   Altered mental status, unspecified   Total Time spent with patient: 20 minutes  Subjective:  refusing to talk to [place] just shaking her head for responses  HPI: Jeanette Walter is a 73 y.o. female patient presented to [facility] ED via EMS.The patient is under involuntarily committed (IVC) status during this ED stay for agitation and combativeness from her independent living facility.  She became agitated during a recent "lockdown" at her facility, less agitated when she can wander around--consistent with her dementia diagnosis.  Medications will be adjusted and geriatric psychiatry will be sought. On the first exam this morning: Patient refused to engage with provider.  She was lying in bed with her eyes open. She answered questions with a head nod.  Per RN the patient has been sleeping most of the day and coopertive. Patient, however, had an outburst last night. On examination this afternoon:  The patient was in the hall trying to leave.  She was requesting a shot to make her die.  When someone asked her the reason she wanted to die, she responded, "You are an idiot." Then, she flipped her middle finger to this provider when told she would receive a shot to help her, but not to kill her.  Redirection back to her room was difficult with security and sitter, required a PRN Ativan injection.    Past Psychiatric History: Dementia  Risk to Self:  No Risk to Others:  No Prior Inpatient Therapy:  Yes Prior Outpatient Therapy:  Yes  Past Medical History:  Past Medical History:  Diagnosis Date  . Dementia (HCC)    History reviewed. No pertinent surgical history. Family History: No family history on file. Family Psychiatric  History:  Social History:   Social History   Substance and Sexual Activity  Alcohol Use Not on file     Social History   Substance and Sexual Activity  Drug Use Not on file    Social History   Socioeconomic History  . Marital status: Single    Spouse name: Not on file  . Number of children: Not on file  . Years of education: Not on file  . Highest education level: Not on file  Occupational History  . Not on file  Social Needs  . Financial resource strain: Not on file  . Food insecurity:    Worry: Not on file    Inability: Not on file  . Transportation needs:    Medical: Not on file    Non-medical: Not on file  Tobacco Use  . Smoking status: Never Smoker  . Smokeless tobacco: Never Used  Substance and Sexual Activity  . Alcohol use: Not on file  . Drug use: Not on file  . Sexual activity: Not on file  Lifestyle  . Physical activity:    Days per week: Not on file    Minutes per session: Not on file  . Stress: Not on file  Relationships  . Social connections:    Talks on phone: Not on file    Gets together: Not on file    Attends religious service: Not on file    Active member of club or organization: Not on file    Attends meetings of clubs or organizations: Not on file    Relationship status: Not on file  Other Topics Concern  . Not on file  Social History Narrative  . Not on file   Additional Social History:    Allergies:   Allergies  Allergen Reactions  . Septra [Sulfamethoxazole-Trimethoprim]   . Sulfa Antibiotics     Labs: No results found for this or any previous visit (from the past 48 hour(s)).  Current Facility-Administered Medications  Medication Dose Route Frequency Provider Last Rate Last Dose  . amLODipine (NORVASC) tablet 5 mg  5 mg Oral Daily Loleta Rose, MD   5 mg at 04/17/18 1041  . aspirin chewable tablet 81 mg  81 mg Oral Daily Loleta Rose, MD   81 mg at 04/17/18 1043  . atenolol (TENORMIN) tablet 50 mg  50 mg Oral Daily Loleta Rose, MD   50 mg at  04/17/18 1043  . divalproex (DEPAKOTE SPRINKLE) capsule 125 mg  125 mg Oral Q12H Arif Amendola, Herminio Heads, NP      . donepezil (ARICEPT) tablet 5 mg  5 mg Oral QHS Loleta Rose, MD   5 mg at 04/18/18 0005  . fluticasone (FLONASE) 50 MCG/ACT nasal spray 1 spray  1 spray Each Nare Daily Loleta Rose, MD   1 spray at 04/17/18 1043  . haloperidol lactate (HALDOL) injection 5 mg  5 mg Intramuscular Once Sharman Cheek, MD   Stopped at 04/16/18 1706  . loratadine (CLARITIN) tablet 10 mg  10 mg Oral Daily Loleta Rose, MD   10 mg at 04/17/18 1044  . LORazepam (ATIVAN) tablet 0.5 mg  0.5 mg Oral BID PRN Charm Rings, NP       Or  . LORazepam (ATIVAN) injection 0.5 mg  0.5 mg Intramuscular BID PRN Charm Rings, NP      . LORazepam (ATIVAN) injection 2 mg  2 mg Intramuscular Once Mariel Craft, MD      . QUEtiapine (SEROQUEL) tablet 100 mg  100 mg Oral QHS Charm Rings, NP      . sertraline (ZOLOFT) tablet 100 mg  100 mg Oral Daily Loleta Rose, MD   100 mg at 04/17/18 1044  . simvastatin (ZOCOR) tablet 20 mg  20 mg Oral Daily Loleta Rose, MD   20 mg at 04/17/18 1044   Current Outpatient Medications  Medication Sig Dispense Refill  . donepezil (ARICEPT) 10 MG tablet Take 10 mg by mouth Nightly.    . QUEtiapine (SEROQUEL) 25 MG tablet Take three tablets ( 75 mg) at night for one week then decrease to two tablets ( 50 mg) nightly.    . QUEtiapine (SEROQUEL) 50 MG tablet Take one tablet nightly along with 25 mg nightly to equal 75 mg nightly.      Musculoskeletal: Strength & Muscle Tone: normal Gait & Station: good Patient leans: NA  Psychiatric Specialty Exam: Physical Exam  Nursing note and vitals reviewed. Constitutional: She is oriented to person, place, and time. She appears well-developed and well-nourished.  Neck: Normal range of motion.  Respiratory: Effort normal.  GI: Soft.  Musculoskeletal: Normal range of motion.  Neurological: She is alert and oriented to person, place,  and time.  Psychiatric: Her speech is normal. Her mood appears anxious. Her affect is blunt and labile. She is agitated. Cognition and memory are impaired. She expresses impulsivity. She expresses suicidal ideation.    Review of Systems  Psychiatric/Behavioral: Positive for memory loss and suicidal ideas. The patient is nervous/anxious.   All other systems reviewed and are negative.   Blood pressure (!) 144/71, pulse  90, temperature 98.1 F (36.7 C), temperature source Oral, resp. rate 16, SpO2 94 %.There is no height or weight on file to calculate BMI.  General Appearance: Fairly Groomed  Eye Contact:  Fair  Speech:  refusing to talk earlier, WDL in the afternoon  Volume:  Increased in afternoon  Mood:  Irritable, agitated, angry  Affect:  blunted  Thought Process:  coherent  Orientation:  Oriented to self  Thought Content:  Linear, goal directed  Suicidal Thoughts:  Yes, wants an injection to kill her  Homicidal Thoughts:  At times, yes  Memory:  Immediate, poor; recent, fair; remote, poor  Judgement:  Impaired  Insight:  Lacking   Psychomotor Activity:  Decreased this morning and increased this afternoon  Concentration:  Concentration: Poor along with attention  Recall:  Poor   Fund of Knowledge:  Fair  Language:  Fair  Akathisia:  NA  Handed:  Right  AIMS (if indicated):     Assets:  Desire for Improvement Housing Social Support  ADL's:  Impaired  Cognition:  moderate  Sleep:        Treatment Plan Summary: Daily contact with patient to assess and evaluate symptoms and progress in treatment and Medication management  Dementia with behavioral changes: -Continue Aricept 5 mg daily -Started Depakote 250 mg BID for mood stabilization -Decreased her Seroquel from 200 mg at bedtime to 100 mg due to increase in drowsiness  Agitation: -Started Ativan 0.5 mg BID PRN  Depression -Continue Zoloft  PO daily  Nutritional support  -Started Chocolate Ensure  BID  Disposition: Recommend psychiatric Inpatient admission when medically cleared.  Nanine Means, NP 04/18/2018 9:56 AM

## 2018-04-18 NOTE — Clinical Social Work Note (Signed)
2:00pm  CSW received phone call from [facility] social worker, that patient may need memory care ALF, CSW called:  [facility] [phone], they do not have female beds Diamantina Monks [phone] left a message waiting for call back Chip Boer [phone] left a message awaiting for call back.  2:50pm CSW received a call back from [facility] social worker, psychiatry is recommending geripsych placement again, patient is still under IVC, this CSW signing off.  Windell Moulding, MSW, LCSW ED Covering CSW [phone] 04/18/2018 2:59 PM

## 2018-04-18 NOTE — ED Notes (Signed)
Patient came out of room and said she was ready to take a shower and then go home. Writer informed patient that she could take a shower after breakfast, staff will get toiletries prepared for her, she went back to bed

## 2018-04-18 NOTE — ED Provider Notes (Signed)
-----------------------------------------   6:40 AM on 04/18/2018 -----------------------------------------   Blood pressure (!) 144/71, pulse 90, temperature 98.1 F (36.7 C), temperature source Oral, resp. rate 16, SpO2 94 %.  The patient is calm and cooperative at this time.  There have been no acute events since the last update.  Awaiting disposition plan from Behavioral Medicine team.   Irean Hong, MD 04/18/18 [phone]

## 2018-04-18 NOTE — ED Notes (Signed)
Patient came out of room and attempted to walk down the hall, staff informed patient that she could not walk down the hall, let her know that she had to stay in or near her room. Patient said "no", and continued to walk down the hall. Staff escorted her back to her room, she stayed at the doorway and told officer she wanted to leave or she wanted the medication you stick in the skin to make me die. Patient then began yelling at the officer and snatched the nurses badge off, when the officer attempted to retrieve the badge, patient attempted to bite him. Officer backed up and patient began yelling and calling all the staff "bitches and idiots"  MD notified. Ativan IM given

## 2018-04-18 NOTE — ED Notes (Signed)
Patient ate 100% dinner and she had 1 ensure chocolate only at 1430 and another chocolate ensure at 1800

## 2018-04-18 NOTE — ED Notes (Signed)
Hourly rounding reveals patient sleeping in room. No complaints, stable, in no acute distress. Q15 minute rounds and monitoring via Security to continue. 

## 2018-04-18 NOTE — ED Notes (Signed)
IVC/Pending placement 

## 2018-04-18 NOTE — ED Notes (Signed)
Attempted to give patient her medications one by one, and in applesauce. Patient said "no" I will not take those she called Clinical research associate a Sales promotion account executive and she didn't trust me

## 2018-04-18 NOTE — BH Assessment (Signed)
Writer spoke with patient's sister (Myra Shipp-[phone]), informed her the patient's disposition is still for Psych Geriatric Inpatient. Discussed with the sister, there have been some brief discussion with reviewing other options for placement in the event patient is unable to be placed with a psych inpatient facility and she longer meets inpatient criteria while in the ER. Writer explained, there is no guarantee the hospital will be able to assist her with long-term placement but options are being looked at in the event she's not placed and ready to discharge from the ER. Sister shared her concern about her been discharged because the facility she's currently placed at, she's unable to pay for it anymore. The sister and her husband have spent approximately $30,000 from their personal money to assist with her care, because the patient's SSI check is $20 over the amount to qualify for the services she's currently receiving and she's can not get medicaid. Sister reports she had researched different options and open to for any help with the process. "She can't live independently anymore, she need a skill facility." She's unable to live with the sister because she's unable to properly care for her.

## 2018-04-18 NOTE — ED Provider Notes (Signed)
Dg Chest 1 View  Result Date: 04/18/2018 CLINICAL DATA:  Shortness of breath. EXAM: CHEST  1 VIEW COMPARISON:  Radiograph of April 16, 2018. FINDINGS: Stable cardiomediastinal silhouette. No pneumothorax or pleural effusion is noted. Left lung is clear. Stable minimal right basilar opacity is noted concerning for subsegmental atelectasis or possibly scarring. Bony thorax is unremarkable. IMPRESSION: Stable minimal right basilar opacity is noted concerning for subsegmental atelectasis or possibly scarring. Electronically Signed   By: Lupita Raider, M.D.   On: 04/18/2018 16:18    Repeat chest x-ray reviewed.  Suspect with afebrile status, normal work of breathing on examination and no complaints of respiratory symptoms at this is unlikely to represent pneumonia.  Likely chronic scarring or atelectasis.   Sharyn Creamer, MD 04/18/18 2314

## 2018-04-18 NOTE — ED Notes (Signed)
Offered patient a shower, she refused and said she will take one when she feels like it, staff asked why she changed her mind, patient said you told me 20 years ago I could take a shower and you lied

## 2018-04-19 NOTE — BH Assessment (Signed)
Patient denied at [facility], [facility].  TTS unable to reach [facility]. Leane Call, [facility].

## 2018-04-19 NOTE — ED Notes (Signed)
Pt refused lunch tray at this time.

## 2018-04-19 NOTE — ED Notes (Signed)
Pt set up for a bedside bath  - clothing and linens changed  She ate 1/2 of her sandwich and a bananna at lunch and drank ginger ale  She states  "When will I get to go home?"  Pt reassured and informed that we are looking for her a safe place to go and it may be another hospital for treatment   She verbalized understanding   Continue to monitor

## 2018-04-19 NOTE — ED Notes (Signed)
Pt. Up using main bathroom in quad.  Pt. Returned to room.  Adjustment made to bed to make patient comfortable.

## 2018-04-19 NOTE — ED Notes (Signed)
Pt. Requested light to be turned off, light turned off.

## 2018-04-19 NOTE — ED Notes (Signed)
Spoke with patient in room.  Pt. Asking "When can I go home".  Pt. States, "I have things I need to do".  Pt. Advised that we are looking for a safe home for her.  Pt. Understands she is in hospital.  Pt. 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  Pt. Calm and cooperative and appreciative  of this nurse talking to her.

## 2018-04-19 NOTE — ED Notes (Signed)
Pt. Given medical book(soft cover) and paper and writing tools.

## 2018-04-19 NOTE — ED Notes (Signed)
BEHAVIORAL HEALTH ROUNDING Patient sleeping: No. Patient alert and oriented: yes Behavior appropriate: Yes.  ; If no, describe:  Nutrition and fluids offered: yes Toileting and hygiene offered: Yes  Sitter present: q15 minute observations and security  monitoring Law enforcement present: Yes  ODS  

## 2018-04-19 NOTE — ED Notes (Signed)
Patient observed lying in bed with eyes closed  Even, unlabored respirations observed   NAD pt appears to be sleeping  I will continue to monitor along with every 15 minute visual observations and ongoing security monitoring    

## 2018-04-19 NOTE — ED Notes (Signed)
Pt. Given graham crackers and drink upon request for snack.

## 2018-04-19 NOTE — ED Notes (Signed)

## 2018-04-19 NOTE — ED Provider Notes (Signed)
-----------------------------------------   6:00 AM on 04/19/2018 -----------------------------------------   Blood pressure (!) 144/71, pulse 90, temperature 98.1 F (36.7 C), temperature source Oral, resp. rate 16, SpO2 94 %.  The patient is sleeping at this time.  There have been no acute events since the last update.  Awaiting disposition plan from Behavioral Medicine team.   Irean Hong, MD 04/19/18 0600

## 2018-04-19 NOTE — ED Notes (Signed)
ED  Is the patient under IVC or is there intent for IVC: Yes.   Is the patient medically cleared: Yes.   Is there vacancy in the ED BHU: Yes.   Is the population mix appropriate for patient: Yes.   Is the patient awaiting placement in inpatient or outpatient setting: Yes.   - placement/geripsych placement  Has the patient had a psychiatric consult: Yes.   Survey of unit performed for contraband, proper placement and condition of furniture, tampering with fixtures in bathroom, shower, and each patient room: Yes.  ; Findings:  APPEARANCE/BEHAVIOR Calm and cooperative NEURO ASSESSMENT Orientation: oriented to self   Denies pain Hallucinations: No.None noted (Hallucinations) denies  Speech: Normal Gait: normal RESPIRATORY ASSESSMENT Even  Unlabored respirations  CARDIOVASCULAR ASSESSMENT Pulses equal   regular rate  Skin warm and dry   GASTROINTESTINAL ASSESSMENT no GI complaint EXTREMITIES Full ROM  PLAN OF CARE Provide calm/safe environment. Vital signs assessed twice daily. ED BHU Assessment once each 12-hour shift. Collaborate with TTS daily or as condition indicates. Assure the ED provider has rounded once each shift. Provide and encourage hygiene. Provide redirection as needed. Assess for escalating behavior; address immediately and inform ED provider.  Assess family dynamic and appropriateness for visitation as needed: Yes.  ; If necessary, describe findings:  Educate the patient/family about BHU procedures/visitation: Yes.  ; If necessary, describe findings:

## 2018-04-19 NOTE — BH Assessment (Signed)
Patient's sister called: (Myra Shipp-[phone]) and provided the patient's code.  She requested an updated about the patient's placement/disposition.  TTS explained that a placement has not yet been secured.  Ms. Shipp expressed concern that the patient will not get a placement and will not have anywhere to go upon discharge. TTS explained that we will not discharge the patient without adequate care. Ms. Carmela Rima has a plan to call the previous assisted living facility and see if the patient can return. [phone])

## 2018-04-19 NOTE — BH Assessment (Signed)
TTS attempted to reassess the patient; however, the patient doesn't seem to fully understand the questions.  When asked about SI/HI, she says "no one mentioned it" and "I don't know where it is. When asked about where she is right now, she says "some big building in [place]."  When asked why she is here, she responds with "I feel like I was stuffed in a glass bottle."  The patient is observed confused about how to place a straw into a cup and attempting to drink from a cup with a lid without a place to drink from. Patient is aware of her person and identify, but not certain of today's date.

## 2018-04-20 MED ORDER — LORAZEPAM 2 MG/ML IJ SOLN
1.0000 mg | Freq: Once | INTRAMUSCULAR | Status: AC
  Administered 2018-04-20: 1 mg via INTRAVENOUS

## 2018-04-20 MED ORDER — LORAZEPAM 2 MG/ML IJ SOLN
INTRAMUSCULAR | Status: AC
  Administered 2018-04-20: 1 mg via INTRAVENOUS
  Filled 2018-04-20: qty 1

## 2018-04-20 MED ORDER — HALOPERIDOL LACTATE 5 MG/ML IJ SOLN
INTRAMUSCULAR | Status: AC
  Administered 2018-04-20: 5 mg via INTRAMUSCULAR
  Filled 2018-04-20: qty 1

## 2018-04-20 MED ORDER — QUETIAPINE FUMARATE 25 MG PO TABS
150.0000 mg | ORAL_TABLET | Freq: Every day | ORAL | Status: DC
  Administered 2018-04-20 – 2018-04-28 (×7): 150 mg via ORAL
  Filled 2018-04-20 (×8): qty 6

## 2018-04-20 MED ORDER — HALOPERIDOL LACTATE 5 MG/ML IJ SOLN
5.0000 mg | Freq: Once | INTRAMUSCULAR | Status: AC
  Administered 2018-04-20: 5 mg via INTRAMUSCULAR

## 2018-04-20 NOTE — ED Notes (Signed)
Pt set up for her bath  - linens changed  - warm water and clean towels and clothing provided to her  - medications pulled from pixis for administration

## 2018-04-20 NOTE — ED Notes (Signed)
Patient observed lying in bed with eyes closed  Even, unlabored respirations observed   NAD pt appears to be sleeping  I will continue to monitor along with every 15 minute visual observations and ongoing security monitoring    

## 2018-04-20 NOTE — ED Notes (Signed)
She continues to bathe herself - while I was attempting to educate her and administer her meds she states  "I am ready to go"  Explained to her that she is waiting for either geriatric psychiatric hospitalization or placement into a memory unit so that she can be safe  - she listened and I believed her to understand - she then stated  "I want my bra - I have been here for six weeks and I want my bra and a couple dozen underwears"  I told her  "you are in the emergency room and showed her the clean clothes for her to put on  - she then began throwing the shampoo bottle and wet washcloths at me  - I remained in the room with her  - attempted to give her the medications as prescribed but she knocked the cup with some of the medicine out of my hand   -  She then pushed me out of the way and put a blanket around her and started walking into the hallway  - manage to get her to realize that she has no clothes on and she cannot go into the hallway "naked"    Pt yelling  "I can do what I want"   IM injections to be administered

## 2018-04-20 NOTE — ED Notes (Signed)
She is sitting up in bed - watching TV  No verbalized needs or concerns at this time   Supper tray provided to her and a ginger ale    BEHAVIORAL HEALTH ROUNDING Patient sleeping: No. Patient alert : yes Behavior appropriate: Yes.  ; If no, describe:  Nutrition and fluids offered: yes Toileting and hygiene offered: Yes  Sitter present: q15 minute observations and security monitoring Law enforcement present: Yes  ODS

## 2018-04-20 NOTE — ED Notes (Signed)
Pt's brief changed and new linen placed on bed

## 2018-04-20 NOTE — ED Notes (Addendum)
Pt glasses were broken lying in the bed with her - a lens had fallen out - I put it back in but it might not stay  - pt encouraged to take her glasses off when she sleeps    Recliner placed into her room  She ate some of her biscuit and drank her juice for breakfast  - hands washed prior to eating

## 2018-04-20 NOTE — ED Notes (Signed)

## 2018-04-20 NOTE — TOC Initial Note (Signed)
TOC CM/SW received social work consult, pt in need of Memory care. A review of notes indicated that patient is a 73 year old Caucasian female patient presented to [facility] ED via EMS from [facility] independent living facility - under involuntarily committed (IVC) status. Per chart reviewed history of dementia who presents to the ED for aggressive and combative behavior on 04/15/2018. TTS involvement and psychiatric consult (see notes).  It appears that IVC was lifted and recommendation is for memory care facility.    Clinician called patient's sister to inform her of referral for memory care.  Patient's family member is Printmaker (sister) [phone] noted that she has been trying to find memory care for pt for the past two months. Stated that "insurance is not going to pay for it." She noted that she is not able to care for her sister.   Plan: CSW to call family on Monday 04/21/2018 regarding possible return to [facility].    Larwance Rote, MSW, LCSW  [phone] 8am-6pm (weekends) or CSW ED # [phone]

## 2018-04-20 NOTE — BH Assessment (Signed)
TTS received a call from [facility] inquiring if the patient was still in need of placement.  [facility] requested the full SSN to verify insurance.  Same was requested and provided by the patient's sister, Myra Shipp. [facility] will verify insurance, review patient, and return call.

## 2018-04-20 NOTE — ED Notes (Signed)
BEHAVIORAL HEALTH ROUNDING Patient sleeping: No. Patient alert : yes Behavior appropriate: Yes.  ; If no, describe:  Nutrition and fluids offered: yes Toileting and hygiene offered: Yes  Sitter present: q15 minute observations and security monitoring Law enforcement present: Yes  ODS  

## 2018-04-20 NOTE — Progress Notes (Addendum)
[facility] MD Progress Note  04/20/2018 2:18 PM Jeanette Walter  MRN:  [id number] Principal Problem: Dementia with behavioral disturbance [facility]) Diagnosis:  Major neurocognitive disorder, moderate to severe, with behavioral disturbance Unspecified personality disorder  Total Time spent with patient: 33 minutes  HPI: Pt was last seen for psychiatric consultation on April 18, 2018.  Today, the patient was sitting in her chair, tells me that she is feeling fine.  She describes herself head-strong and that she can come angry if she does not get her way.  Patient became angry at the nurse earlier today because she was not able to wear her bra in her room; it seems that she was asking for multiple other things as well.  She denies suicidal and homicidal thoughts.  Denies symptoms of psychosis.  Moreover, denies panic attacks or anxiety issues today.  Per report, she declined her psychiatric medications yesterday and today.  She did take her Seroquel 3 mg last night.   Past Medical History:  Past Medical History:  Diagnosis Date  . Dementia (HCC)    History reviewed. No pertinent surgical history. Family History: No family history on file. Social History:  Social History   Substance and Sexual Activity  Alcohol Use Not on file     Social History   Substance and Sexual Activity  Drug Use Not on file    Social History   Socioeconomic History  . Marital status: Single    Spouse name: Not on file  . Number of children: Not on file  . Years of education: Not on file  . Highest education level: Not on file  Occupational History  . Not on file  Social Needs  . Financial resource strain: Not on file  . Food insecurity:    Worry: Not on file    Inability: Not on file  . Transportation needs:    Medical: Not on file    Non-medical: Not on file  Tobacco Use  . Smoking status: Never Smoker  . Smokeless tobacco: Never Used  Substance and Sexual Activity  . Alcohol use: Not on file  . Drug use:  Not on file  . Sexual activity: Not on file  Lifestyle  . Physical activity:    Days per week: Not on file    Minutes per session: Not on file  . Stress: Not on file  Relationships  . Social connections:    Talks on phone: Not on file    Gets together: Not on file    Attends religious service: Not on file    Active member of club or organization: Not on file    Attends meetings of clubs or organizations: Not on file    Relationship status: Not on file  Other Topics Concern  . Not on file  Social History Narrative  . Not on file   Additional Social History:                         Sleep: Good  Appetite:  Fair  Current Medications: Current Facility-Administered Medications  Medication Dose Route Frequency Provider Last Rate Last Dose  . amLODipine (NORVASC) tablet 5 mg  5 mg Oral Daily Loleta Rose, MD   5 mg at 04/17/18 1041  . aspirin chewable tablet 81 mg  81 mg Oral Daily Loleta Rose, MD   81 mg at 04/17/18 1043  . atenolol (TENORMIN) tablet 50 mg  50 mg Oral Daily Loleta Rose, MD   50  mg at 04/17/18 1043  . divalproex (DEPAKOTE SPRINKLE) capsule 250 mg  250 mg Oral Q12H Charm Rings, NP   250 mg at 04/19/18 2115  . donepezil (ARICEPT) tablet 5 mg  5 mg Oral QHS Loleta Rose, MD   5 mg at 04/19/18 2115  . feeding supplement (ENSURE ENLIVE) (ENSURE ENLIVE) liquid 237 mL  237 mL Oral BID BM Charm Rings, NP   237 mL at 04/18/18 1800  . fluticasone (FLONASE) 50 MCG/ACT nasal spray 1 spray  1 spray Each Nare Daily Loleta Rose, MD   1 spray at 04/17/18 1043  . loratadine (CLARITIN) tablet 10 mg  10 mg Oral Daily Loleta Rose, MD   10 mg at 04/17/18 1044  . LORazepam (ATIVAN) tablet 0.5 mg  0.5 mg Oral BID PRN Charm Rings, NP   0.5 mg at 04/20/18 1229   Or  . LORazepam (ATIVAN) injection 0.5 mg  0.5 mg Intramuscular BID PRN Charm Rings, NP   0.5 mg at 04/20/18 1242  . QUEtiapine (SEROQUEL) tablet 100 mg  100 mg Oral QHS Charm Rings, NP   100  mg at 04/19/18 2115  . sertraline (ZOLOFT) tablet 100 mg  100 mg Oral Daily Loleta Rose, MD   100 mg at 04/17/18 1044  . simvastatin (ZOCOR) tablet 20 mg  20 mg Oral Daily Loleta Rose, MD   20 mg at 04/17/18 1044   Current Outpatient Medications  Medication Sig Dispense Refill  . donepezil (ARICEPT) 10 MG tablet Take 10 mg by mouth Nightly.    . QUEtiapine (SEROQUEL) 25 MG tablet Take three tablets ( 75 mg) at night for one week then decrease to two tablets ( 50 mg) nightly.    . QUEtiapine (SEROQUEL) 50 MG tablet Take one tablet nightly along with 25 mg nightly to equal 75 mg nightly.      Lab Results: No results found for this or any previous visit (from the past 48 hour(s)).  Blood Alcohol level:  Lab Results  Component Value Date   ETH <10 04/15/2018    Metabolic Disorder Labs: No results found for: HGBA1C, MPG No results found for: PROLACTIN No results found for: CHOL, TRIG, HDL, CHOLHDL, VLDL, LDLCALC  Physical Findings: AIMS:  , ,  ,  ,    CIWA:    COWS:     Musculoskeletal: Strength & Muscle Tone: normal Gait & Station: good Patient leans: NA  Psychiatric Specialty Exam:  Musculoskeletal: Normal range of motion.  Neurological: She is alert and oriented to person, place, and time.  Psychiatric: Her speech is normal. Her mood:"fine'". Her affect neutral and calm.  Cognition and memory are impaired. She expresses no impulsivity. She expresses suicidal ideation.    Review of Systems  Psychiatric/Behavioral: Positive for memory loss and negative for suicidal ideas. T   All other systems reviewed and are negative.   Blood pressure (!) 144/71, pulse 90, temperature 98.1 F (36.7 C), temperature source Oral, resp. rate 16, SpO2 94 %.There is no height or weight on file to calculate BMI.  General Appearance: Fairly Groomed  Eye Contact:  Fair  Speech:  refusing to talk earlier, WDL in the afternoon  Volume:  Increased in afternoon  Mood:  netral  Affect:   blunted  Thought Process:  coherent  Orientation:  Oriented to self  Thought Content:  Linear, goal directed  Suicidal Thoughts:  NO  Homicidal Thought NO  Memory:  Immediate, poor; recent, fair; remote, poor  Judgement:  Impaired  Insight:  Lacking   Psychomotor Activity:  Decreased this morning and increased this afternoon  Concentration:  Concentration: Poor along with attention  Recall:  Poor   Fund of Knowledge:  Fair  Language:  Fair  Akathisia:  NA  Handed:  Right  AIMS (if indicated):     Assets:  Desire for Improvement Housing Social Support  ADL's:  Impaired  Cognition:  moderate  Sleep:      Treatment Plan Summary: We will increase Seroquel  Patient will need memory care Encouraged her to take her medications Discussed with nurse Discussed with TST We will obtain a social work consult.  Patient will need a memory care type facility.   Reggie Pile, MD 04/20/2018, 2:18 PM

## 2018-04-20 NOTE — ED Notes (Signed)
ED  Is the patient under IVC or is there intent for IVC: Yes.   Is the patient medically cleared: Yes.   Is there vacancy in the ED BHU: Yes.   Is the population mix appropriate for patient:   geriatric  Is the patient awaiting placement in inpatient or outpatient setting: Yes.  Awaiting geriatric psychiatric admission and/or memory care placement   Has the patient had a psychiatric consult: Yes.   Survey of unit performed for contraband, proper placement and condition of furniture, tampering with fixtures in bathroom, shower, and each patient room: Yes.  ; Findings:  APPEARANCE/BEHAVIOR Calm and cooperative NEURO ASSESSMENT Orientation: oriented to self  Denies pain Hallucinations: No.None noted (Hallucinations) Speech: Normal Gait: normal RESPIRATORY ASSESSMENT Even  Unlabored respirations  CARDIOVASCULAR ASSESSMENT Pulses equal   regular rate  Skin warm and dry   GASTROINTESTINAL ASSESSMENT no GI complaint EXTREMITIES Full ROM  PLAN OF CARE Provide calm/safe environment. Vital signs assessed twice daily. ED BHU Assessment once each 12-hour shift. Collaborate with TTS daily or as condition indicates. Assure the ED provider has rounded once each shift. Provide and encourage hygiene. Provide redirection as needed. Assess for escalating behavior; address immediately and inform ED provider.  Assess family dynamic and appropriateness for visitation as needed: Yes.  ; If necessary, describe findings:  Educate the patient/family about BHU procedures/visitation: Yes.  ; If necessary, describe findings:

## 2018-04-20 NOTE — ED Notes (Addendum)
She is sitting up in the recliner in her room  - TV station with a movie found for her   BEHAVIORAL HEALTH ROUNDING Patient sleeping: No. Patient alert : yes Behavior appropriate: Yes.  ; If no, describe:  Nutrition and fluids offered: yes Toileting and hygiene offered: Yes  Sitter present: q15 minute observations and security monitoring Law enforcement present: Yes  ODS

## 2018-04-20 NOTE — ED Notes (Addendum)
BEHAVIORAL HEALTH ROUNDING Patient sleeping: Yes.   Patient alert: eyes closed  Appears asleep Behavior appropriate: Yes.  ; If no, describe:  Nutrition and fluids offered: Yes  Toileting and hygiene offered: sleeping Sitter present: q 15 minute observations and security monitoring Law enforcement present: yes  ODS

## 2018-04-20 NOTE — ED Notes (Signed)
IVC patient pending placement 

## 2018-04-20 NOTE — BH Assessment (Signed)
Patient's sister called: (Myra Shipp-[phone]) for an update. TTS gave a brief update and provided support to Ms. Shipp who is concerned about her sister's care. TTS explained that the patient has not yet been accepted to any hospitals and it may be a long process.  Sister is concerned that the patient will not have a placement and she will not know what to do.  She is requesting that when the patient is ready to be discharged that she be able to consult with the discharge social worker. TTS explained this is the procedure and that the patient would not be discharged without a clear plan. (~16 minutes)

## 2018-04-20 NOTE — ED Notes (Signed)
Patient in hallway naked.  Patient physically aggressive toward staff refusing redirection.

## 2018-04-20 NOTE — ED Notes (Signed)
She had an accident in the BR - clean undergarment provided and assistance provided with putting it on

## 2018-04-20 NOTE — ED Notes (Signed)
Upon entering her room to administer IM - she immediately grabbed my left arm and started twisting it - I knocked on the door for security to come and assist  - pt then sat down on the bed  - IM to right deltoid administered  - she continued to be verbally aggressive during this interaction

## 2018-04-20 NOTE — ED Provider Notes (Signed)
-----------------------------------------   7:18 AM on 04/20/2018 -----------------------------------------   Blood pressure (!) 145/70, pulse (!) 103, temperature 98.1 F (36.7 C), temperature source Oral, resp. rate 16, SpO2 93 %.  The patient is calm and cooperative at this time.  There have been no acute events since the last update.  Awaiting disposition plan from Behavioral Medicine team.   Dionne Bucy, MD 04/20/18 [phone]

## 2018-04-21 NOTE — ED Notes (Signed)
Hourly rounding reveals patient in room. No complaints, stable, in no acute distress. Q15 minute rounds and monitoring via Rover and Officer to continue.   

## 2018-04-21 NOTE — ED Notes (Signed)
Pt depend changed by Aurther Loft, RN and this Clinical research associate.  Pt encouraged to eat dinner tray. Maintained on 15 minute checks and observation by security camera for safety.

## 2018-04-21 NOTE — NC FL2 (Signed)
[place] MEDICAID FL2 LEVEL OF CARE SCREENING TOOL     IDENTIFICATION  Patient Name: Jeanette Walter Birthdate: July 23, 1945 Sex: female Admission Date (Current Location): 04/15/2018  [place] and [place] Number:  Chiropodist and Address:  [facility], [address], [place], [place] [address]      Provider Number: [id number]  Attending Physician Name and Address:  No att. providers found  Relative Name and Phone Number:  [name]  [phone](pt sister)    Current Level of Care: Hospital Recommended Level of Care: Memory Care Prior Approval Number:    Date Approved/Denied:   PASRR Number: Pending   Discharge Plan: Other (Comment)(memory care)    Current Diagnoses: Patient Active Problem List   Diagnosis Date Noted  . Dementia with behavioral disturbance (HCC)     Orientation RESPIRATION BLADDER Height & Weight     Self  Normal Continent Weight:   Height:     BEHAVIORAL SYMPTOMS/MOOD NEUROLOGICAL BOWEL NUTRITION STATUS  Wanderer, Verbally abusive   Continent Diet  AMBULATORY STATUS COMMUNICATION OF NEEDS Skin   Independent Verbally Normal                       Personal Care Assistance Level of Assistance  Bathing, Dressing Bathing Assistance: Limited assistance   Dressing Assistance: Limited assistance     Functional Limitations Info             SPECIAL CARE FACTORS FREQUENCY                       Contractures Contractures Info: Not present    Additional Factors Info  Code Status, Allergies Code Status Info: full code ; Allergies Info: Septra ; Sulfa Antibiotics            Current Medications (04/21/2018):  This is the current hospital active medication list Current Facility-Administered Medications  Medication Dose Route Frequency Provider Last Rate Last Dose  . amLODipine (NORVASC) tablet 5 mg  5 mg Oral Daily Loleta Rose, MD   5 mg at 04/17/18 1041  . aspirin chewable tablet 81 mg  81 mg  Oral Daily Loleta Rose, MD   81 mg at 04/17/18 1043  . atenolol (TENORMIN) tablet 50 mg  50 mg Oral Daily Loleta Rose, MD   50 mg at 04/17/18 1043  . divalproex (DEPAKOTE SPRINKLE) capsule 250 mg  250 mg Oral Q12H Charm Rings, NP   250 mg at 04/20/18 2142  . donepezil (ARICEPT) tablet 5 mg  5 mg Oral QHS Loleta Rose, MD   5 mg at 04/20/18 2141  . feeding supplement (ENSURE ENLIVE) (ENSURE ENLIVE) liquid 237 mL  237 mL Oral BID BM Charm Rings, NP   237 mL at 04/18/18 1800  . fluticasone (FLONASE) 50 MCG/ACT nasal spray 1 spray  1 spray Each Nare Daily Loleta Rose, MD   1 spray at 04/17/18 1043  . loratadine (CLARITIN) tablet 10 mg  10 mg Oral Daily Loleta Rose, MD   10 mg at 04/17/18 1044  . LORazepam (ATIVAN) tablet 0.5 mg  0.5 mg Oral BID PRN Charm Rings, NP   0.5 mg at 04/20/18 1229   Or  . LORazepam (ATIVAN) injection 0.5 mg  0.5 mg Intramuscular BID PRN Charm Rings, NP   0.5 mg at 04/20/18 1242  . QUEtiapine (SEROQUEL) tablet 150 mg  150 mg Oral QHS Reggie Pile, MD   150 mg at 04/20/18  2141  . sertraline (ZOLOFT) tablet 100 mg  100 mg Oral Daily Loleta Rose, MD   100 mg at 04/17/18 1044  . simvastatin (ZOCOR) tablet 20 mg  20 mg Oral Daily Loleta Rose, MD   20 mg at 04/17/18 1044   Current Outpatient Medications  Medication Sig Dispense Refill  . donepezil (ARICEPT) 10 MG tablet Take 10 mg by mouth Nightly.    . QUEtiapine (SEROQUEL) 25 MG tablet Take three tablets ( 75 mg) at night for one week then decrease to two tablets ( 50 mg) nightly.    . QUEtiapine (SEROQUEL) 50 MG tablet Take one tablet nightly along with 25 mg nightly to equal 75 mg nightly.       Discharge Medications: Please see discharge summary for a list of discharge medications.  Relevant Imaging Results:  Relevant Lab Results:   Additional Information    Paityn Balsam T Charelle Petrakis, LCSW

## 2018-04-21 NOTE — ED Notes (Signed)
Report to include Situation, Background, Assessment, and Recommendations received from Amy RN. Patient alert and oriented, warm and dry, in no acute distress. Unable to assess SI, HI and AVH due to patient having advance dementia . Patient made aware of Q15 minute rounds and Psychologist, counselling presence for their safety.

## 2018-04-21 NOTE — Progress Notes (Signed)
Psychiatrist given update on patient compliance and presentation this shift.

## 2018-04-21 NOTE — Progress Notes (Signed)
Patient continues to be resistant to care and non-complaint with prescribed medications. Will continue to encourage compliance with prescribed medications and MD orders. Will continue to monitor for safety.

## 2018-04-21 NOTE — Progress Notes (Signed)
Patient wanting to get up now and try to eat. Patient encouraged to eat and given fluids she asks for to encourage hydration.

## 2018-04-21 NOTE — ED Notes (Signed)
Report on Situation, Background, Assessment, and Recommendations received from [place], [place]. Patient sleeping and in no visible distress. Pt. To be monitored per routine.

## 2018-04-21 NOTE — ED Provider Notes (Addendum)
-----------------------------------------   3:22 AM on 04/21/2018 -----------------------------------------   Blood pressure (!) 154/88, pulse 98, temperature 97.8 F (36.6 C), temperature source Oral, resp. rate 18, SpO2 96 %.  The patient is calm and cooperative at this time.  She has twice gotten out of bed and got a lay down on the floor.  There have been no acute events since the last update.  Awaiting disposition plan from Behavioral Medicine team.    Arnaldo Natal, MD 04/21/18 [id number]    Arnaldo Natal, MD 04/21/18 [phone]

## 2018-04-21 NOTE — TOC Progression Note (Signed)
Transition of Care [facility]) - Progression Note    Patient Details  Name: Jeanette Walter MRN: [id number] Date of Birth: Dec 08, 1945  Transition of Care [facility]) CM/SW Contact  Allayne Butcher, RN Phone Number: 04/21/2018, 3:00 PM  Clinical Narrative:    RNCM reached out to patient's sister Jeanette Walter.  Jeanette is extremely stressed about the patient's status and she is not sure what to do.  Patient cannot care for herself due to her dementia and she needs Memory Care.  Jeanette has been looking for facilities but has not had much luck within her price range.  Jeanette asked that I reach out to [facility] in [place] which is ALF but does not have Memory Care.  RNCM spoke with [facility] admissions coordinator, Lennox Laity reports with that patient's dementia the facility would not be appropriate.  Referrals sent out to Memory Care Facilities in the area, waiting to hear back for any bed offers.  [facility] is full at this time.  RNCM will follow and update sister as more information is available.  Sister requested for patient condition update- bedside RN asked to reach out and reports that she will.    Expected Discharge Plan: Memory Care Barriers to Discharge: Awaiting State Approval (PASRR), Inadequate or no insurance, Other (comment)(Needs Memory Care)  Expected Discharge Plan and Services Expected Discharge Plan: Memory Care In-house Referral: Clinical Social Work Discharge Planning Services: CM Consult   Living arrangements for the past 2 months: Assisted Living Facility([facility])                           Social Determinants of Health (SDOH) Interventions    Readmission Risk Interventions No flowsheet data found.

## 2018-04-22 NOTE — TOC Progression Note (Signed)
Transition of Care [facility]) - Progression Note    Patient Details  Name: Jeanette Walter MRN: [id number] Date of Birth: 1945-07-29  Transition of Care [facility]) CM/SW Contact  Allayne Butcher, RN Phone Number: 04/22/2018, 4:11 PM  Clinical Narrative:    RNCM updated patient's sister on progress.  Multiple facilities have been contacted, have not been able to find an available bed in [facility].  The other barrier is that even if a facility has a bed the patient cannot afford Memory care and the patient's sister can also not afford Memory Care Placement.  Sister is very distressed about the situation, she reports she has contacted Engineer, site and applied for Medicaid but has not heard anything back from them.  Sister reports that she has also called multiple facilities on her own with no luck or they are not equipped to care for the patient.  RNCM has contacted and left a message with [facility] Medicaid intake team and left a message to check the status of the Medicaid application.   RNCM will cont to follow.     Expected Discharge Plan: Memory Care Barriers to Discharge: Awaiting State Approval [facility]), SNF Pending bed offer  Expected Discharge Plan and Services Expected Discharge Plan: Memory Care In-house Referral: Clinical Social Work Discharge Planning Services: CM Consult   Living arrangements for the past 2 months: Assisted Living Facility([facility])                           Social Determinants of Health (SDOH) Interventions    Readmission Risk Interventions No flowsheet data found.

## 2018-04-22 NOTE — ED Notes (Signed)
Hourly rounding reveals patient sitting in chair in room.  No complaints, stable, in no acute distress. Q15 minute rounds and monitoring via Rover and Officer to continue. 

## 2018-04-22 NOTE — ED Notes (Signed)
Changed pt's bedding and walked pt to the restroom. Pt is now in bed resting.

## 2018-04-22 NOTE — ED Notes (Signed)
Hourly rounding reveals patient in room. No complaints, stable, in no acute distress. Q15 minute rounds and monitoring via Rover and Officer to continue.   

## 2018-04-22 NOTE — ED Notes (Signed)
Pt walked to restroom and walked back to bed without assistance.

## 2018-04-22 NOTE — ED Notes (Signed)
Gave food tray with juice. 

## 2018-04-22 NOTE — ED Notes (Signed)
Pt asleep, breakfast tray placed in rm.  

## 2018-04-22 NOTE — ED Provider Notes (Signed)
-----------------------------------------   5:57 AM on 04/22/2018 -----------------------------------------   Blood pressure 134/61, pulse 95, temperature 98.1 F (36.7 C), temperature source Oral, resp. rate 17, SpO2 90 %.  The patient is sleeping at this time.  There have been no acute events since the last update.  Awaiting disposition plan from Behavioral Medicine/social work team.   Irean Hong, MD 04/22/18 [phone]

## 2018-04-22 NOTE — ED Notes (Signed)
Pt agitated and physically aggressive.  Pt grabbing trash can and dirty utility bind from nurse.  Pt attempting to go into other patient's room.   Unable to redirect.

## 2018-04-22 NOTE — TOC Progression Note (Signed)
Transition of Care [facility]) - Progression Note    Patient Details  Name: Jeanette Walter MRN: [id number] Date of Birth: 1946-01-04  Transition of Care [facility]) CM/SW Contact  Allayne Butcher, RN Phone Number: 04/22/2018, 1:42 PM  Clinical Narrative:    RNCM has attempted to reach out to area Memory Care units, [address] [place], [address], [place] have no beds to offer.  Waiting for return calls from other facilities.  RNCM will cont to try and find placement.    Expected Discharge Plan: Memory Care Barriers to Discharge: Awaiting State Approval (PASRR), Inadequate or no insurance, Other (comment)(Needs Memory Care)  Expected Discharge Plan and Services Expected Discharge Plan: Memory Care In-house Referral: Clinical Social Work Discharge Planning Services: CM Consult   Living arrangements for the past 2 months: Assisted Living Facility([facility])                           Social Determinants of Health (SDOH) Interventions    Readmission Risk Interventions No flowsheet data found.

## 2018-04-22 NOTE — ED Notes (Signed)
Per Jerilynn Som TTS  patient's IVC paperwork should expire as it is a social work issue

## 2018-04-22 NOTE — ED Notes (Signed)
Gave lunch tray and drink.

## 2018-04-22 NOTE — ED Notes (Signed)
Hourly rounding reveals patient in room. No complaints, stable, in no acute distress. Q15 minute rounds and monitoring via Security Cameras to continue. 

## 2018-04-22 NOTE — ED Notes (Signed)
Cleaned pt's room up, took commode out of pt's room, pt is able to walk to bathroom with no problems. Reported to [employer].

## 2018-04-22 NOTE — ED Notes (Signed)
Hourly rounding reveals patient sleeping in room. No complaints, stable, in no acute distress. Q15 minute rounds and monitoring via Rover and Officer to continue.  

## 2018-04-22 NOTE — ED Notes (Signed)
Pt consumed 8 oz. of protein shake. Requested some chocolate ice cream RN Rhea said it would be ok. I gave her 4 oz. of chocolate ice cream. She is sitting in her chair resting and eating ice cream.

## 2018-04-22 NOTE — ED Notes (Signed)
IVC papers to expire today 3/31

## 2018-04-22 NOTE — ED Notes (Signed)
Report to include Situation, Background, Assessment, and Recommendations received from [facility]. Patient sleeping after meds.

## 2018-04-22 NOTE — ED Notes (Signed)
Patient awake, warm and dry, in no acute distress. Patient refuses to answer questions related to SI, HI, AVH and pain. Patient made aware of Q15 minute rounds and Psychologist, counselling presence for their safety. Patient instructed to come to me with needs or concerns.

## 2018-04-23 NOTE — ED Notes (Signed)
Patient observed lying in bed with eyes closed  Even, unlabored respirations observed   NAD pt appears to be sleeping  I will continue to monitor along with every 15 minute visual observations and ongoing security monitoring    

## 2018-04-23 NOTE — ED Notes (Signed)
Pt  Vol  Pending  placement 

## 2018-04-23 NOTE — ED Notes (Signed)
Hourly rounding reveals patient sleeping in room. No complaints, stable, in no acute distress. Q15 minute rounds and monitoring via Rover and Officer to continue.  

## 2018-04-23 NOTE — ED Notes (Signed)
Staff into her room  - she has sat back down into the recliner in her room  Continue to monitor

## 2018-04-23 NOTE — ED Notes (Signed)
BEHAVIORAL HEALTH ROUNDING Patient sleeping: Yes.   Patient alert and oriented: eyes closed  Appears asleep Behavior appropriate: Yes.  ; If no, describe:  Nutrition and fluids offered: Yes  Toileting and hygiene offered: sleeping Sitter present: q 15 minute observations and security monitoring Law enforcement present: yes  ODS 

## 2018-04-23 NOTE — ED Notes (Signed)
BEHAVIORAL HEALTH ROUNDING Patient sleeping: No. Patient alert  yes Behavior appropriate:  Angry - attempts made to reorient / reassure her that she is in the ED  .  ; If no, describe:  Nutrition and fluids offered: yes Toileting and hygiene offered: Yes  Sitter present: q15 minute observations and security monitoring Law enforcement present: Yes  ODS
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
## 2018-04-23 NOTE — ED Notes (Addendum)
Supper has been provided  -  - she has come into the hallway - throwing food at staff and throwing her blanket into the hallway    Medication to be adminstered

## 2018-04-23 NOTE — ED Notes (Signed)
BEHAVIORAL HEALTH ROUNDING Patient sleeping: No. Patient alert : yes Behavior appropriate: Yes.  ; If no, describe:  Nutrition and fluids offered: yes Toileting and hygiene offered: Yes  Sitter present: q15 minute observations and security monitoring Law enforcement present: Yes  ODS  
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
## 2018-04-23 NOTE — TOC Progression Note (Signed)
Transition of Care [facility]) - Progression Note    Patient Details  Name: Jeanette Walter MRN: [id number] Date of Birth: [birth year]  Transition [facility]) CM/SW Contact  Tania Masson Nalepa, LCSW Phone Number: 04/23/2018, 4:10 PM  Clinical Narrative:    3:30pm - CSW was informed that [facility] would not be able to admit pt due to not having any Medicaid beds available. Catelyn from [facility] shared that she was informed that pt would not be able to afford private pay rates. CM/CSW continuing to find placement.   Expected Discharge Plan: Memory Care Barriers to Discharge: Awaiting State Approval [facility]), SNF Pending bed offer  Expected Discharge Plan and Services Expected Discharge Plan: Memory Care In-house Referral: Clinical Social Work Discharge Planning Services: CM Consult   Living arrangements for the past 2 months: Assisted Living Facility([facility])                           Social Determinants of Health (SDOH) Interventions    Readmission Risk Interventions No flowsheet data found.

## 2018-04-23 NOTE — ED Notes (Signed)

## 2018-04-23 NOTE — TOC Progression Note (Signed)
Transition of Care [facility]) - Progression Note    Patient Details  Name: Jeanette Walter MRN: [id number] Date of Birth: 04-17-1945  Transition of Care [facility]) CM/SW Contact  Cala Bradford, LCSW  Phone Number: [phone] 04/23/2018, 1:49 PM  Clinical Narrative:    1:43PM - CSW received a return phone call from Catelyn and [facility] requesting information for pt. CSW faxed over initial referral, FL2, and therapy notes.   Expected Discharge Plan: Memory Care Barriers to Discharge: Awaiting State Approval [facility]), SNF Pending bed offer  Expected Discharge Plan and Services Expected Discharge Plan: Memory Care In-house Referral: Clinical Social Work Discharge Planning Services: CM Consult   Living arrangements for the past 2 months: Assisted Living Facility([facility])                           Social Determinants of Health (SDOH) Interventions    Readmission Risk Interventions No flowsheet data found.

## 2018-04-23 NOTE — ED Notes (Signed)
Pt. Currently resting in bed.  Pt. Calm and cooperative.

## 2018-04-23 NOTE — ED Notes (Signed)
ED Is the patient under IVC or is there intent for IVC: Yes.   Is the patient medically cleared: Yes.   Is there vacancy in the ED BHU: Yes.   Is the population mix appropriate for patient: she is geriatric Is the patient awaiting placement in inpatient or outpatient setting: Yes.   Has the patient had a psychiatric consult: Yes.   Survey of unit performed for contraband, proper placement and condition of furniture, tampering with fixtures in bathroom, shower, and each patient room: Yes.  ; Findings:  APPEARANCE/BEHAVIOR Calm and cooperative NEURO ASSESSMENT Orientation: oriented to self   Denies pain Hallucinations: No.None noted (Hallucinations) denies Speech: Normal Gait: normal RESPIRATORY ASSESSMENT Even  Unlabored respirations  CARDIOVASCULAR ASSESSMENT Pulses equal   regular rate  Skin warm and dry   GASTROINTESTINAL ASSESSMENT no GI complaint EXTREMITIES Full ROM  PLAN OF CARE Provide calm/safe environment. Vital signs assessed twice daily. ED BHU Assessment once each 12-hour shift. Collaborate with TTS daily or as condition indicates. Assure the ED provider has rounded once each shift. Provide and encourage hygiene. Provide redirection as needed. Assess for escalating behavior; address immediately and inform ED provider.  Assess family dynamic and appropriateness for visitation as needed: Yes.  ; If necessary, describe findings:  Educate the patient/family about BHU procedures/visitation: Yes.  ; If necessary, describe findings:

## 2018-04-24 NOTE — TOC Progression Note (Signed)
Transition of Care [facility]) - Progression Note    Patient Details  Name: Jeanette Walter MRN: [id number] Date of Birth: 07/06/1945  Transition of Care [facility]) CM/SW Contact  Allayne Butcher, RN Phone Number: 04/24/2018, 3:25 PM  Clinical Narrative:    APS returned RNCM call- report made and guardianship request has been placed.  APS will keep RNCM updated.    Expected Discharge Plan: Memory Care Barriers to Discharge: Family Issues, Unsafe home situation  Expected Discharge Plan and Services Expected Discharge Plan: Memory Care In-house Referral: Clinical Social Work Discharge Planning Services: CM Consult   Living arrangements for the past 2 months: Assisted Living Facility([facility])                           Social Determinants of Health (SDOH) Interventions    Readmission Risk Interventions No flowsheet data found.

## 2018-04-24 NOTE — ED Notes (Signed)
Patient came out of her room and attempted to grab staff, staff attempted to redirected patient back to her room and she stated "I want to walk" and stood in the hallway. Patient told staff to leave her alone. Patient then walked back into her room.

## 2018-04-24 NOTE — ED Notes (Signed)
Hourly rounding reveals patient in room. No complaints, stable, in no acute distress. Q15 minute rounds and monitoring via Rover and Officer to continue.   

## 2018-04-24 NOTE — ED Notes (Signed)
Hourly rounding reveals patient sleeping in room. No complaints, stable, in no acute distress. Q15 minute rounds and monitoring via Security to continue. 

## 2018-04-24 NOTE — ED Provider Notes (Signed)
-----------------------------------------   6:47 AM on 04/24/2018 -----------------------------------------   Blood pressure 139/78, pulse 99, temperature 98.1 F (36.7 C), temperature source Oral, resp. rate 16, SpO2 97 %.  The patient is sleeping at this time.  There have been no acute events since the last update.  Awaiting disposition plan from Behavioral Medicine/social work team.   Irean Hong, MD 04/24/18 [phone]

## 2018-04-24 NOTE — ED Notes (Signed)
Report to include Situation, Background, Assessment, and Recommendations received from Jadeka RN. Patient alert and oriented, warm and dry, in no acute distress. Patient denies SI, HI, AVH and pain. Patient made aware of Q15 minute rounds and Rover and Officer presence for their safety. Patient instructed to come to me with needs or concerns.  

## 2018-04-24 NOTE — ED Notes (Signed)
Pt linen and clothing was change. Pt now has on a depends, only because pt wasn't wearing underwear at that time.

## 2018-04-24 NOTE — ED Notes (Addendum)
Patient came out from her room trying to leave. Staff redirected the patient back to her room. Patient became irritated and started to scream and yelling " let me go". Notified MD

## 2018-04-24 NOTE — ED Notes (Signed)
Patient observed lying left side on the floor by NT. Staff didn't hear a thud or loud voice coming from the room. Per staff patient has a hx of getting out from her bed and sliding down on the floor. Patient is disoriented X 4. Patient had no injuries. Charge nurse and MD notified. Bed alarm was locked. Fall risk arm band and fall risk sign on the door. Vital signs are with defined limits.

## 2018-04-24 NOTE — ED Notes (Signed)
Pt asleep, lunch tray placed on sink.

## 2018-04-24 NOTE — ED Notes (Signed)
Patient is resting comfortably. 

## 2018-04-24 NOTE — ED Notes (Signed)
Hourly rounding reveals patient in room. No complaints, stable, in no acute distress. Q15 minute rounds and monitoring via Security Cameras to continue. 

## 2018-04-24 NOTE — ED Notes (Signed)
Pt asleep, breakfast tray placed in rm.  

## 2018-04-24 NOTE — ED Notes (Signed)
Patient took her medications with no issues, nurse opened each medication and placed in a cup and gave patient the cup and she placed them in her mouth.

## 2018-04-24 NOTE — BH Specialist Note (Cosign Needed)
Subjective:  "I'm trying to get to the bathroom."   Jeanette Walter is a 73 y.o. female patient presented to [facility] ED via EMS. The patient was seen face-to-face today by psych provider re-evaluating her progress; chart reviewed. Patient is alert to self.  During her assessment the patient is unable to participate in it due to AMS. During the encounter the patient is unable to answer basic questions.    HPI: per Dr. Roxan Hockey; HPI  Jeanette Walter is a 73 y.o. female with advanced dementia presents the ER after recent discharge for worsening dementia agitation and combativeness with EMS professionals. They state that she is currently living in a independent living facility and did not feel comfortable leaving her there.    On reevaluation, patient is calm and cooperative.  It was discussed with her nurse this evening that she remains complaint with her medications. She had a good day. She has had no behavioral issues. When post the questions to her about wanting to hurt herself or anyone else she states "oh, no honey, I don't want to hurt myself or nobody else." The patient does not know where she is currently and is not participating in most of the questions presented to her.

## 2018-04-24 NOTE — ED Notes (Signed)
VOL/Pending Placement 

## 2018-04-24 NOTE — ED Notes (Signed)
Patient spoke with her sister and said hi, patient then asked for a sandwich and a drink. Patient woke up in a pleasant mood and said thank you to staff.

## 2018-04-24 NOTE — ED Notes (Signed)
Spoke with patients sister Myra and she stated that patient has been a CNA for 98yrs and she loves doing Sudoko puzzles and talking about healthcare information.

## 2018-04-24 NOTE — TOC Progression Note (Signed)
Transition of Care [facility]) - Progression Note    Patient Details  Name: Jeanette Walter MRN: [id number] Date of Birth: 1945-03-26  Transition of Care [facility]) CM/SW Contact  Allayne Butcher, RN Phone Number: 04/24/2018, 2:44 PM  Clinical Narrative:    Call placed to [place] [facility].  Left message for them to return call.  Patient does not have a safe discharge plan.  Patient is unable to care for herself and she has no family that is willing or able to care for her.   Expected Discharge Plan: Memory Care Barriers to Discharge: Awaiting State Approval [facility]), SNF Pending bed offer  Expected Discharge Plan and Services Expected Discharge Plan: Memory Care In-house Referral: Clinical Social Work Discharge Planning Services: CM Consult   Living arrangements for the past 2 months: Assisted Living Facility([facility])                           Social Determinants of Health (SDOH) Interventions    Readmission Risk Interventions No flowsheet data found.

## 2018-04-25 LAB — VALPROIC ACID LEVEL: Valproic Acid Lvl: 43 ug/mL — ABNORMAL LOW (ref 50.0–100.0)

## 2018-04-25 MED ORDER — DIVALPROEX SODIUM 125 MG PO CSDR
250.0000 mg | DELAYED_RELEASE_CAPSULE | Freq: Three times a day (TID) | ORAL | Status: DC
  Administered 2018-04-25 – 2018-04-30 (×5): 250 mg via ORAL
  Filled 2018-04-25 (×17): qty 2

## 2018-04-25 NOTE — ED Notes (Signed)
Hourly rounding reveals patient in room. No complaints, stable, in no acute distress. Q15 minute rounds and monitoring via Rover and Officer to continue.   
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
## 2018-04-25 NOTE — ED Notes (Signed)
RN, KIm @ pt bedside at this time
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
## 2018-04-25 NOTE — ED Notes (Signed)
Pt attempting to crawl out of bed, pt getting harder to redirect but redirectable w/ time, Rn Notified

## 2018-04-25 NOTE — ED Notes (Addendum)
Pt provided meal tray by this tech, pt is currently asleep at this time

## 2018-04-25 NOTE — Consult Note (Signed)
[facility] Face-to-Face Psychiatry Consult   Reason for Consult: Altered mental status Referring Physician: [facility] ED MD Patient Identification: Jeanette Walter MRN:  [id number] Principal Diagnosis: Dementia with behavioral disturbance (HCC) Diagnosis:  Principal Problem:   Dementia with behavioral disturbance (HCC)   Total Time spent with patient: 20 minutes  Subjective:  Patient forwarded minimal on assessment.  Awakens around 11 am.  Patient is stable for transfer to a [facility] being facilitated by care management.  Psychiatrically cleared, geriatric psychiatric not warranted at this time.  Dr Lucianne Muss consulted and reviewed this patient, concurs with the plan.  HPI: Jeanette Walter is a 73 y.o. female patient presented to [facility] ED via EMS.The patient is under involuntarily committed (IVC) status during this ED stay for agitation and combativeness from her independent living facility.  She became agitated during a recent "lockdown" at her facility, less agitated when she can wander around--consistent with her dementia diagnosis.  Medications adjusted, valporic acid level obtained, 43-below therapeutic threshold.  Increased BID Depakote 250 mg to TID.  Patient threatens at times but no overt actions.  Her dementia appears to be progressing, stable enough to transfer to a [facility].    Past Psychiatric History: Dementia  Risk to Self:  No Risk to Others:  No Prior Inpatient Therapy:  Yes Prior Outpatient Therapy:  Yes  Past Medical History:  Past Medical History:  Diagnosis Date  . Dementia (HCC)    History reviewed. No pertinent surgical history. Family History: No family history on file. Family Psychiatric  History:  Social History:  Social History   Substance and Sexual Activity  Alcohol Use Not on file     Social History   Substance and Sexual Activity  Drug Use Not on file    Social History   Socioeconomic History  . Marital status: Single    Spouse name: Not on  file  . Number of children: Not on file  . Years of education: Not on file  . Highest education level: Not on file  Occupational History  . Not on file  Social Needs  . Financial resource strain: Not on file  . Food insecurity:    Worry: Not on file    Inability: Not on file  . Transportation needs:    Medical: Not on file    Non-medical: Not on file  Tobacco Use  . Smoking status: Never Smoker  . Smokeless tobacco: Never Used  Substance and Sexual Activity  . Alcohol use: Not on file  . Drug use: Not on file  . Sexual activity: Not on file  Lifestyle  . Physical activity:    Days per week: Not on file    Minutes per session: Not on file  . Stress: Not on file  Relationships  . Social connections:    Talks on phone: Not on file    Gets together: Not on file    Attends religious service: Not on file    Active member of club or organization: Not on file    Attends meetings of clubs or organizations: Not on file    Relationship status: Not on file  Other Topics Concern  . Not on file  Social History Narrative  . Not on file   Additional Social History:    Allergies:   Allergies  Allergen Reactions  . Septra [Sulfamethoxazole-Trimethoprim]   . Sulfa Antibiotics     Labs: No results found for this or any previous visit (from the past 48 hour(s)).  Current Facility-Administered Medications  Medication Dose Route Frequency Provider Last Rate Last Dose  . amLODipine (NORVASC) tablet 5 mg  5 mg Oral Daily Loleta Rose, MD   5 mg at 04/24/18 1130  . aspirin chewable tablet 81 mg  81 mg Oral Daily Loleta Rose, MD   81 mg at 04/24/18 1130  . atenolol (TENORMIN) tablet 50 mg  50 mg Oral Daily Loleta Rose, MD   50 mg at 04/24/18 1130  . divalproex (DEPAKOTE SPRINKLE) capsule 250 mg  250 mg Oral Q12H Charm Rings, NP   250 mg at 04/24/18 2035  . donepezil (ARICEPT) tablet 5 mg  5 mg Oral QHS Loleta Rose, MD   5 mg at 04/24/18 2035  . feeding supplement (ENSURE  ENLIVE) (ENSURE ENLIVE) liquid 237 mL  237 mL Oral BID BM Charm Rings, NP   237 mL at 04/22/18 1311  . fluticasone (FLONASE) 50 MCG/ACT nasal spray 1 spray  1 spray Each Nare Daily Loleta Rose, MD   1 spray at 04/17/18 1043  . loratadine (CLARITIN) tablet 10 mg  10 mg Oral Daily Loleta Rose, MD   10 mg at 04/24/18 1130  . LORazepam (ATIVAN) tablet 0.5 mg  0.5 mg Oral BID PRN Charm Rings, NP   0.5 mg at 04/20/18 1229   Or  . LORazepam (ATIVAN) injection 0.5 mg  0.5 mg Intramuscular BID PRN Charm Rings, NP   0.5 mg at 04/24/18 2259  . QUEtiapine (SEROQUEL) tablet 150 mg  150 mg Oral QHS Reggie Pile, MD   150 mg at 04/24/18 2035  . sertraline (ZOLOFT) tablet 100 mg  100 mg Oral Daily Loleta Rose, MD   100 mg at 04/24/18 1130  . simvastatin (ZOCOR) tablet 20 mg  20 mg Oral Daily Loleta Rose, MD   20 mg at 04/24/18 1130   Current Outpatient Medications  Medication Sig Dispense Refill  . donepezil (ARICEPT) 10 MG tablet Take 10 mg by mouth Nightly.    . QUEtiapine (SEROQUEL) 25 MG tablet Take three tablets ( 75 mg) at night for one week then decrease to two tablets ( 50 mg) nightly.    . QUEtiapine (SEROQUEL) 50 MG tablet Take one tablet nightly along with 25 mg nightly to equal 75 mg nightly.      Musculoskeletal: Strength & Muscle Tone: normal Gait & Station: good Patient leans: NA  Psychiatric Specialty Exam: Physical Exam  Nursing note and vitals reviewed. Constitutional: She is oriented to person, place, and time. She appears well-developed and well-nourished.  Neck: Normal range of motion.  Respiratory: Effort normal.  GI: Soft.  Musculoskeletal: Normal range of motion.  Neurological: She is alert and oriented to person, place, and time.  Psychiatric: Her speech is normal and behavior is normal. Thought content normal. Her affect is blunt and labile. Cognition and memory are impaired. She expresses impulsivity.    Review of Systems  Psychiatric/Behavioral:  Positive for memory loss.  All other systems reviewed and are negative.   Blood pressure (!) 112/57, pulse 79, temperature 98.5 F (36.9 C), temperature source Oral, resp. rate 16, SpO2 95 %.There is no height or weight on file to calculate BMI.  General Appearance: Fairly Groomed  Eye Contact:  Fair  Speech:  refusing to talk earlier  Volume:  WDL  Mood:  Anxious at times  Affect:  blunted  Thought Process:  coherent  Orientation:  Oriented to self  Thought Content:  Linear, goal directed  Suicidal  Thoughts:  No  Homicidal Thoughts:  No  Memory:  Immediate, poor; recent, fair; remote, poor  Judgement:  Impaired  Insight:  Lacking   Psychomotor Activity:  Decreased   Concentration:  Concentration: Poor along with attention  Recall:  Poor   Fund of Knowledge:  Fair  Language:  Fair  Akathisia:  NA  Handed:  Right  AIMS (if indicated):     Assets:  Desire for Improvement Housing Social Support  ADL's:  Impaired  Cognition:  moderate  Sleep:        Treatment Plan Summary: Daily contact with patient to assess and evaluate symptoms and progress in treatment and Medication management  Dementia with behavioral changes: -Continue Aricept 5 mg daily -Depakote level ordered -Increased Depakote 250 mg BID to TID for mood stabilization as level was 43, not therapeutic -Continued Seroquel 150 mg at bedtime  Agitation: -Continued Ativan 0.5 mg BID PRN   Depression -Continue Zoloft  PO daily  Nutritional support  -Started Chocolate Ensure BID  Disposition: Psychiatrically discharged, social work placement into a [facility]  Chapin, Catha Nottingham, NP 04/25/2018 9:33 AM

## 2018-04-25 NOTE — ED Provider Notes (Signed)
-----------------------------------------   8:59 AM on 04/25/2018 -----------------------------------------   Blood pressure (!) 112/57, pulse 79, temperature 98.5 F (36.9 C), temperature source Oral, resp. rate 16, SpO2 95 %.  The patient had no acute events since last update.  Calm and cooperative at this time.  Disposition is pending per Psychiatry/Behavioral Medicine team recommendations.     Don Perking, [place], MD 04/25/18 [phone]

## 2018-04-25 NOTE — ED Notes (Addendum)
Pt awake an attempting to crawl out of bed, pt able to be redirected but redirect is lengthy, pt offered to meal tray previously given and pt refuses,pt offered a drink and refused as well, RN Notified

## 2018-04-25 NOTE — TOC Progression Note (Signed)
Transition of Care [facility]) - Progression Note    Patient Details  Name: Jeanette Walter MRN: [id number] Date of Birth: 01/29/1945  [facility]) CM/SW Contact  Allayne Butcher, RN Phone Number: 04/25/2018, 12:20 PM  Clinical Narrative:    RNCM updated patient's sister Myra that guardianship process has been started by APS- Myra reports that she sent off the Medicaid application.     Expected Discharge Plan: Memory Care Barriers to Discharge: Family Issues, Unsafe home situation  Expected Discharge Plan and Services Expected Discharge Plan: Memory Care In-house Referral: Clinical Social Work Discharge Planning Services: CM Consult   Living arrangements for the past 2 months: Assisted Living Facility([facility])                           Social Determinants of Health (SDOH) Interventions    Readmission Risk Interventions No flowsheet data found.
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
## 2018-04-25 NOTE — ED Notes (Signed)
Vol pending soc work placement 

## 2018-04-26 ENCOUNTER — Emergency Department: Payer: Medicare Other

## 2018-04-26 NOTE — ED Notes (Signed)
Patient very agitated and trying to get out of bed continuously.  Patient is unstable and needs assistance walking. I got patient out of bed to assist in to the bedside commode and changed her breif. She set in recliner about and hour and remained calm.AS

## 2018-04-26 NOTE — ED Notes (Signed)
Patient took medications in chocolate pudding with no problems

## 2018-04-26 NOTE — ED Notes (Signed)
Patient pad changed.

## 2018-04-26 NOTE — ED Notes (Addendum)
Administered depakote sprinkles in applesauce

## 2018-04-26 NOTE — ED Notes (Signed)
Patient continues to try and get out of bed, patient is demonstrating unsafe behavior. Patient is unsteady on feet at times.

## 2018-04-26 NOTE — ED Notes (Signed)
Offered patient [place] tray assisted with feeding and drinking. Patient only took a few bits of the sandwich, wanted only water to drink.AS

## 2018-04-26 NOTE — ED Notes (Signed)
Patient inconstant brief changed and bed linens. Patient had ripped off previous underpants.

## 2018-04-26 NOTE — ED Notes (Signed)
Hourly rounding reveals patient sleeping in room. No complaints, stable, in no acute distress. Q15 minute rounds and monitoring via Security to continue. 

## 2018-04-26 NOTE — Social Work (Addendum)
TOC CM/SW reporting in.  APS report made on 04/24/2018 regarding guardianship. Waiting for placement.  CSW is monitoring.   Larwance Rote, MSW, LCSW  [phone] 8am-6pm (weekends) or CSW ED # [phone]
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
## 2018-04-26 NOTE — ED Provider Notes (Signed)
-----------------------------------------   7:08 AM on 04/26/2018 -----------------------------------------   Blood pressure 127/61, pulse 76, temperature 98.5 F (36.9 C), temperature source Oral, resp. rate 18, SpO2 97 %.  The patient is calm and cooperative at this time.  There have been no acute events since the last update.  Awaiting disposition plan from Behavioral/social work teams.  Patient has been medically cleared.    Minna Antis, MD 04/26/18 [phone]

## 2018-04-26 NOTE — ED Notes (Signed)
Patient walked around room with staff for a while, patient was reaching for objects that are not there. Patient was able to urinate in the bedside commode 30cc

## 2018-04-27 LAB — COMPREHENSIVE METABOLIC PANEL
ALT: 38 U/L (ref 0–44)
AST: 42 U/L — ABNORMAL HIGH (ref 15–41)
Albumin: 4 g/dL (ref 3.5–5.0)
Alkaline Phosphatase: 57 U/L (ref 38–126)
Anion gap: 12 (ref 5–15)
BUN: 12 mg/dL (ref 8–23)
CO2: 25 mmol/L (ref 22–32)
Calcium: 8.9 mg/dL (ref 8.9–10.3)
Chloride: 104 mmol/L (ref 98–111)
Creatinine, Ser: 0.57 mg/dL (ref 0.44–1.00)
GFR calc Af Amer: >60 mL/min (ref >60)
GFR calc non Af Amer: >60 mL/min (ref >60)
Glucose, Bld: 89 mg/dL (ref 70–99)
Potassium: 3.7 mmol/L (ref 3.5–5.1)
Sodium: 141 mmol/L (ref 135–145)
Total Bilirubin: 0.9 mg/dL (ref 0.3–1.2)
Total Protein: 7.2 g/dL (ref 6.5–8.1)

## 2018-04-27 LAB — CBC WITH DIFFERENTIAL/PLATELET
Abs Immature Granulocytes: 0.02 10*3/uL (ref 0.00–0.07)
Basophils Absolute: 0.1 10*3/uL (ref 0.0–0.1)
Basophils Relative: 1 %
Eosinophils Absolute: 0.4 10*3/uL (ref 0.0–0.5)
Eosinophils Relative: 5 %
HCT: 42.6 % (ref 36.0–46.0)
Hemoglobin: 14.1 g/dL (ref 12.0–15.0)
Immature Granulocytes: 0 %
Lymphocytes Relative: 26 %
Lymphs Abs: 2.3 10*3/uL (ref 0.7–4.0)
MCH: 29.1 pg (ref 26.0–34.0)
MCHC: 33.1 g/dL (ref 30.0–36.0)
MCV: 87.8 fL (ref 80.0–100.0)
Monocytes Absolute: 0.9 10*3/uL (ref 0.1–1.0)
Monocytes Relative: 11 %
Neutro Abs: 5 10*3/uL (ref 1.7–7.7)
Neutrophils Relative %: 57 %
Platelets: 201 10*3/uL (ref 150–400)
RBC: 4.85 MIL/uL (ref 3.87–5.11)
RDW: 13.4 % (ref 11.5–15.5)
WBC: 8.7 10*3/uL (ref 4.0–10.5)
nRBC: 0 % (ref 0.0–0.2)

## 2018-04-27 LAB — TROPONIN I: Troponin I: <0.03 ng/mL (ref <0.03)

## 2018-04-27 MED ORDER — LORAZEPAM 2 MG/ML IJ SOLN
1.0000 mg | Freq: Once | INTRAMUSCULAR | Status: AC
  Administered 2018-04-27: 1 mg via INTRAMUSCULAR
  Filled 2018-04-27: qty 1

## 2018-04-27 MED ORDER — HALOPERIDOL LACTATE 5 MG/ML IJ SOLN
5.0000 mg | Freq: Once | INTRAMUSCULAR | Status: AC
  Administered 2018-04-27: 5 mg via INTRAMUSCULAR
  Filled 2018-04-27: qty 1

## 2018-04-27 NOTE — ED Notes (Signed)
Patient agitated, loud, and verbally aggressive toward staff.

## 2018-04-27 NOTE — ED Provider Notes (Signed)
-----------------------------------------   12:40 AM on 04/27/2018 -----------------------------------------   Blood pressure (!) 171/93, pulse 91, temperature 97.6 F (36.4 C), temperature source Oral, resp. rate 16, SpO2 97 %.  The patient is calm and cooperative at this time.  There have been no acute events since the last update.  Awaiting disposition plan from Behavioral Medicine team.    Arnaldo Natal, MD 04/27/18 0040

## 2018-04-27 NOTE — ED Notes (Signed)
Vol pending soc work placement 

## 2018-04-27 NOTE — ED Notes (Signed)
Patient ate 3 spoonfuls of chocolate ice cream and then would not eat anymore, wiped patients mouth and when wiped her face she yelled out "move" and went back to sleep

## 2018-04-27 NOTE — ED Notes (Signed)
Patient seen by Dr. Mayford Knife MD doctor, patient continues to be drowsy

## 2018-04-27 NOTE — ED Provider Notes (Signed)
-----------------------------------------   12:34 AM on 04/27/2018 -----------------------------------------   Blood pressure (!) 171/93, pulse 91, temperature 97.6 F (36.4 C), temperature source Oral, resp. rate 16, SpO2 97 %.  Patient is getting rowdy and yelling somewhat threatening.  We will see if we can give her a little bit of medicine and get her sleeping..  Awaiting disposition plan from Behavioral Medicine team.    Arnaldo Natal, MD 04/27/18 [phone]

## 2018-04-27 NOTE — ED Notes (Signed)
Attempted to wake patient to administer medication, not able to wake patient up from sleep. Pt restig and snoring in bed with eyes closed,respirations regular, even and unlabored. arouses easily. No s/s of pain distress observed. Transport planner sitting by door.

## 2018-04-27 NOTE — ED Notes (Signed)
Patient continues to be drowsy while labs are being drawn for routine bloodwork. MD notified

## 2018-04-27 NOTE — ED Notes (Signed)
Hourly rounding reveals patient sleeping in room. No complaints, stable, in no acute distress. Q15 minute rounds and monitoring via Security to continue. 

## 2018-04-28 NOTE — ED Notes (Signed)
Hourly rounding reveals patient sleeping in room. No complaints, stable, in no acute distress. Q15 minute rounds and monitoring via Rover and Officer to continue.  

## 2018-04-28 NOTE — ED Notes (Signed)
Pt asleep, breakfast tray placed in rm.  

## 2018-04-28 NOTE — ED Notes (Signed)
Patient observed lying in bed with eyes closed  Even, unlabored respirations observed   NAD pt appears to be sleeping  I will continue to monitor along with every 15 minute visual observations and ongoing security monitoring    

## 2018-04-28 NOTE — ED Notes (Signed)

## 2018-04-28 NOTE — ED Notes (Signed)
BEHAVIORAL HEALTH ROUNDING Patient sleeping: No. Patient alert : yes Behavior appropriate: Yes.  ; If no, describe:  Nutrition and fluids offered: yes Toileting and hygiene offered: Yes  Sitter present: q15 minute observations and security monitoring Law enforcement present: Yes  ODS  

## 2018-04-28 NOTE — ED Provider Notes (Signed)
-----------------------------------------   1:12 PM on 04/28/2018 -----------------------------------------   BP 109/69 (BP Location: Left Arm)   Pulse 84   Temp 98.7 F (37.1 C) (Oral)   Resp 16   SpO2 97%   No acute events overnight. Vitals reviewed. Patient remains medically cleared.  Disposition is pending per Psychiatry/Behavioral Medicine team recommendations.    Jene Every, MD 04/28/18 1312

## 2018-04-28 NOTE — ED Notes (Signed)
Pt sleeping. Informed her vitals needed to be checked and responded with "Shh" and "Shut that up," to the beeping dynamap.  Incontinence brief is dry at this time.

## 2018-04-28 NOTE — ED Notes (Signed)
She is awake  - clothing and linen changed

## 2018-04-28 NOTE — ED Notes (Signed)
ED  Is the patient under IVC or is there intent for IVC:  Voluntary  Is the patient medically cleared: Yes.   Is there vacancy in the ED BHU: Yes.   Is the population mix appropriate for patient: geriatric pt    Is the patient awaiting placement in inpatient or outpatient setting: Yes.   Social work consult is in progress - she is awaiting placement in memory care  Has the patient had a psychiatric consult: Yes.   Survey of unit performed for contraband, proper placement and condition of furniture, tampering with fixtures in bathroom, shower, and each patient room: Yes.  ; Findings:  APPEARANCE/BEHAVIOR  cooperative  - eyes closed  - she has been resting all shift  NEURO ASSESSMENT Orientation: oriented to self  Hallucinations: No.None noted (Hallucinations) Speech: Normal Gait: normal RESPIRATORY ASSESSMENT Even  Unlabored respirations  CARDIOVASCULAR ASSESSMENT Pulses equal   regular rate  Skin warm and dry   GASTROINTESTINAL ASSESSMENT no GI complaint EXTREMITIES Full ROM  PLAN OF CARE Provide calm/safe environment. Vital signs assessed twice daily. ED BHU Assessment once each 12-hour shift. Collaborate with TTS when available or as condition indicates. Assure the ED provider has rounded once each shift. Provide and encourage hygiene. Provide redirection as needed. Assess for escalating behavior; address immediately and inform ED provider.  Assess family dynamic and appropriateness for visitation as needed: Yes.  ; If necessary, describe findings:  Educate the patient/family about BHU procedures/visitation: Yes.  ; If necessary, describe findings:

## 2018-04-28 NOTE — ED Notes (Signed)
Report to include Situation, Background, Assessment, and Recommendations received from [facility] RN. Patient alert, warm and dry, in no acute distress. Patient refuses to answer questions relating to SI, HI, AVH and pain. Patient made aware of Q15 minute rounds and Psychologist, counselling presence for their safety. Patient instructed to come to me with needs or concerns.

## 2018-04-28 NOTE — ED Notes (Signed)
Pt is now cleaned and dry and is sleep at this time. Pt mumbled words but didn't open eyes or roll over while being changed.

## 2018-04-28 NOTE — ED Notes (Signed)
BEHAVIORAL HEALTH ROUNDING Patient sleeping: Yes.   Patient alert and oriented: eyes closed  Appears to be asleep Behavior appropriate: Yes.  ; If no, describe:  Nutrition and fluids offered: Yes  Toileting and hygiene offered: sleeping Sitter present: q 15 minute observations and security monitoring Law enforcement present: yes  ODS 

## 2018-04-28 NOTE — TOC Progression Note (Signed)
Transition of Care [facility]) - Progression Note    Patient Details  Name: Jeanette Walter MRN: [id number] Date of Birth: 11-15-1945  Transition of Care [facility]) CM/SW Contact  Allayne Butcher, RN Phone Number: 04/28/2018, 4:40 PM  Clinical Narrative:    Patient's sister Myra called today and reported that she has filed the [facility] application.  RNCM informed Myra that still waiting on information from APS about guardianship.  No other updates at this time.     Expected Discharge Plan: Memory Care Barriers to Discharge: Family Issues, Unsafe home situation  Expected Discharge Plan and Services Expected Discharge Plan: Memory Care In-house Referral: Clinical Social Work Discharge Planning Services: CM Consult   Living arrangements for the past 2 months: Assisted Living Facility([facility])                           Social Determinants of Health (SDOH) Interventions    Readmission Risk Interventions No flowsheet data found.

## 2018-04-28 NOTE — ED Notes (Signed)
BEHAVIORAL HEALTH ROUNDING Patient sleeping: Yes.   Patient alert and oriented: eyes closed  Appears asleep Behavior appropriate: Yes.  ; If no, describe:  Nutrition and fluids offered: Yes  Toileting and hygiene offered: sleeping Sitter present: q 15 minute observations and security monitoring Law enforcement present: yes  ODS 

## 2018-04-28 NOTE — ED Notes (Signed)
Gave food tray with juice. Pt is sleeping at this time.

## 2018-04-29 NOTE — ED Notes (Signed)
Hourly rounding reveals patient sleeping in room. No complaints, stable, in no acute distress. Q15 minute rounds and monitoring via Rover and Officer to continue.  

## 2018-04-29 NOTE — ED Notes (Signed)
VOL  PENDING  PLACEMENT 

## 2018-04-29 NOTE — ED Provider Notes (Signed)
-----------------------------------------   5:49 AM on 04/29/2018 -----------------------------------------   Blood pressure 137/75, pulse 92, temperature 98.8 F (37.1 C), resp. rate 18, SpO2 94 %.  The patient is calm and cooperative at this time.  There have been no acute events since the last update.  Pending placement.   Jeanette Rose, MD 04/29/18 [phone]

## 2018-04-29 NOTE — ED Notes (Signed)
She has been sleeping this am  - I will attempt to administer medications when she awakens  Continue to monitor

## 2018-04-29 NOTE — ED Notes (Signed)
Report to include Situation, Background, Assessment, and Recommendations received from Wendy RN. Patient alert, warm and dry, in no acute distress. Patient refuses to answer questions related to SI, HI, AVH and pain. Patient made aware of Q15 minute rounds and Rover and Officer presence for their safety. Patient instructed to come to me with needs or concerns.  

## 2018-04-29 NOTE — ED Notes (Signed)

## 2018-04-29 NOTE — ED Notes (Signed)
BEHAVIORAL HEALTH ROUNDING Patient sleeping: Yes.   Patient alert and oriented: eyes closed  Appears to be asleep Behavior appropriate: Yes.  ; If no, describe:  Nutrition and fluids offered: Yes  Toileting and hygiene offered: sleeping Sitter present: q 15 minute observations and security monitoring Law enforcement present: yes  ODS 

## 2018-04-29 NOTE — ED Notes (Signed)
Patient is up, she went to bathroom, she is trying to wander, so redirected, she went back to bed, will continue to monitor, she refused food at this time. Patient is safe, q 15 minute checks.

## 2018-04-29 NOTE — ED Notes (Signed)
Patient frustrated and wants to go walk down the hallways, nurse and guard redirected her back to room and she tried to swing at nurse, but then went back to bed, she was tired and laid down, Patient is confused and weak, nurse did get her to drink water earlier, but she will not eat or take medications, will continue to monitor.

## 2018-04-29 NOTE — ED Notes (Signed)
Hourly rounding reveals patient in room. No complaints, stable, in no acute distress. Q15 minute rounds and monitoring via Rover and Officer to continue.   

## 2018-04-29 NOTE — ED Notes (Signed)
Patient observed lying in bed with eyes closed  Even, unlabored respirations observed   NAD pt appears to be sleeping  I will continue to monitor along with every 15 minute visual observations and ongoing security monitoring    

## 2018-04-29 NOTE — ED Notes (Signed)
Hourly rounding reveals patient in room. Still being uncooperative. No complaints, stable, in no acute distress. Q15 minute rounds and monitoring via Psychologist, counselling to continue.

## 2018-04-29 NOTE — ED Notes (Signed)
Pt awake and trying to get out of bed to leave.  Pt drand cup of apple juice and ate a handful of fries.  Pt continues to attempt to get out of bed.  Toileting offered but pt refused.  Pt swinging and cursing attempting to throw meal tray.  Able to get pt to lie down.  Continues to talk to people who arent there.

## 2018-04-29 NOTE — ED Notes (Signed)
Hourly rounding reveals patient in room. Still being uncooperative. No complaints, stable, in no acute distress. Q15 minute rounds and monitoring via Rover and Officer to continue.  

## 2018-04-29 NOTE — ED Notes (Signed)
Patient remains sleeping, no signs of distress, respirations even and unlabored, she turns side to side, will continue to monitor.

## 2018-04-29 NOTE — ED Notes (Signed)
Hourly rounding reveals patient sleeping in room. No complaints, stable, in no acute distress. Q15 minute rounds and monitoring via Security Cameras to continue. 

## 2018-04-30 DIAGNOSIS — R404 Transient alteration of awareness: Secondary | ICD-10-CM

## 2018-04-30 DIAGNOSIS — F0391 Unspecified dementia with behavioral disturbance: Secondary | ICD-10-CM

## 2018-04-30 MED ORDER — DIVALPROEX SODIUM 125 MG PO CSDR
750.0000 mg | DELAYED_RELEASE_CAPSULE | Freq: Every day | ORAL | Status: DC
  Administered 2018-05-01 – 2018-05-30 (×25): 750 mg via ORAL
  Filled 2018-04-30 (×33): qty 6

## 2018-04-30 MED ORDER — OLANZAPINE 5 MG PO TBDP
5.0000 mg | ORAL_TABLET | Freq: Every day | ORAL | Status: DC
  Administered 2018-04-30: 5 mg via ORAL
  Filled 2018-04-30: qty 1

## 2018-04-30 MED ORDER — OLANZAPINE 5 MG PO TBDP
2.5000 mg | ORAL_TABLET | Freq: Two times a day (BID) | ORAL | Status: DC
  Administered 2018-04-30 – 2018-05-30 (×32): 2.5 mg via ORAL
  Filled 2018-04-30 (×30): qty 1

## 2018-04-30 NOTE — ED Notes (Signed)
VOL/Pending placement 

## 2018-04-30 NOTE — Evaluation (Signed)
Physical Therapy Evaluation Patient Details Name: Jeanette Walter MRN: [id number] DOB: 08/22/1945 Today's Date: 04/30/2018   History of Present Illness  Jeanette Walter is a 73 y.o. female with a history of dementia who presented to the ED on 04/14/2018 for aggressive and combative behavior.  She was brought from [facility] independent living with involuntary commitment paperwork after being found trying to run away from her facility.  She has a history of dementia and was unable to provide a reliable history, so information was obtained through chart review.  Her facility has been on lockdown for a suspected coronavirus patient which appears to have triggered increased agitation. She presented two previous times to the ER for AMS in the month prior to this ER admission. has been awaiting placement in geropsychiatric facility for many days while in the ED and is unable to return home at this time or stay with her sister due to lack of ability to provide the care she needs in these settings. PT was ordered due to concern about decline in function while awating placement. She has been combative at times. Relevant past medical history include dementia, HTN.  Chest radiograh shows chronic bronchitic changes.     Clinical Impression  Prior to hospitalization, pt lived in independent living facility and received assistance from home health aide, home health nursing, and home health physical therapy. She was ambulatory with no mention of assistive device. She was unable to provide reliable history so PLOF was obtained through chart review. Upon physical therapy evaluation, pt had difficulty following commands resulting in max A supine to sit at start of session but was able to complete sit to supine and scooting in bed with supervision. She completed sit <> stand transfers and ambulation with and without a RW with min A to prevent falls. She ambulated approximately 2x200 feet. She demo poor safety awareness and difficulty  following commands resulting in high fall risk. Concern over ability to retain information to improve safety with mobility. Patient appears to have experienced decrease in functional independence and would benefit from skilled physical therapy to address her impairments and functional limitations and return to PLOF or maximal functional independence.     Follow Up Recommendations SNF    Equipment Recommendations  Rolling walker with 5" wheels    Recommendations for Other Services       Precautions / Restrictions Precautions Precautions: Fall;Other (comment) Precaution Comments: AMS, agitation at times Restrictions Weight Bearing Restrictions: No      Mobility  Bed Mobility   Bed Mobility: Supine to Sit;Sit to Supine     Supine to sit: Max assist Sit to supine: Supervision   General bed mobility comments: Patient asleep at start of session and would not participate until she sat up. Required max A to go from supine to sit but once there became more alert and participatory. Appears to be limited by cognition and arousal vs strength as she was able to go sit to supine and adjust her position in bed with supervision at end of session.   Transfers Overall transfer level: Needs assistance Equipment used: None Transfers: Sit to/from Stand Sit to Stand: Min assist         General transfer comment: pt transfered sit <> stand x 2 trials with min A for balance without AD. D  Ambulation/Gait Ambulation/Gait assistance: Min assist Gait Distance (Feet): 200 Feet Assistive device: Rolling walker (2 wheeled);None Gait Pattern/deviations: Trunk flexed;Staggering left;Staggering right     General Gait Details: Patient ambulated ~  200 feet x 2 trials with min A. First trial without AD. Pt required min A assistance at gait belt and shoulder to prevent falls and guide course. Patient ambulating impulsively without good control of balance trying to move faster than her feet would go at  times. Repeated 200 feet with RW with min A to prevent falls and help pt properly manageme AD. Pt failed to keep walker close to body, stepping outside of walker at times. Also became confused and turned walker sideways holding on to one handle with both hands. Demo difficulty problem solving.  At one point reached down to RW wheel to look at it without loss of balance.   Stairs            Wheelchair Mobility    Modified Rankin (Stroke Patients Only)       Balance Overall balance assessment: Needs assistance Sitting-balance support: Bilateral upper extremity supported Sitting balance-Leahy Scale: Good Sitting balance - Comments: after fully awake, able to sit on edge of bed and weight shift    Standing balance support: No upper extremity supported Standing balance-Leahy Scale: Poor Standing balance comment: unable to maintain balance without UE support.                             Pertinent Vitals/Pain      Home Living Family/patient expects to be discharged to:: Other (Comment)([facility])                      Prior Function Level of Independence: Needs assistance      ADL's / Homemaking Assistance Needed: She received help from home health aid, home RN, and home PT  Comments: lives in independent living facility. Patient with AMS and history of dementia and unable to provide reliable history. PLOF drived from chart review. Reports of ambulation without assistive device.      Hand Dominance        Extremity/Trunk Assessment   Upper Extremity Assessment Upper Extremity Assessment: Generalized weakness    Lower Extremity Assessment Lower Extremity Assessment: Generalized weakness    Cervical / Trunk Assessment Cervical / Trunk Assessment: Other exceptions Cervical / Trunk Exceptions: mildly stooped  Communication   Communication: Other (comment)(AMS, confused)  Cognition Arousal/Alertness: Lethargic;Awake/alert(was  difficult to wake, but after sitting up was more alert, layed down and closed eyes at end of session and no longer followed commands. ) Behavior During Therapy: Flat affect;Impulsive Overall Cognitive Status: History of cognitive impairments - at baseline                                 General Comments: pt has history of dementia, agitation and being combative. She cooperated with physical therapy during this session but was confused had diffiulty following single commands.       General Comments      Exercises Other Exercises Other Exercises: practiced ambulation with RW with cuing for safe use of AD with proper positioning of body and AD.    Assessment/Plan    PT Assessment Patient needs continued PT services  PT Problem List Decreased strength;Decreased coordination;Decreased cognition;Decreased activity tolerance;Decreased knowledge of use of DME;Decreased balance;Decreased safety awareness;Decreased mobility;Decreased knowledge of precautions       PT Treatment Interventions DME instruction;Therapeutic exercise;Gait training;Balance training;Stair training;Neuromuscular re-education;Functional mobility training;Therapeutic activities;Patient/family education    PT Goals (Current goals can be  found in the Care Plan section)  Acute Rehab PT Goals PT Goal Formulation: Patient unable to participate in goal setting    Frequency Min 2X/week   Barriers to discharge Decreased caregiver support;Other (comment) cognitive and behavioral status    Co-evaluation               AM-PAC PT "6 Clicks" Mobility  Outcome Measure Help needed turning from your back to your side while in a flat bed without using bedrails?: A Little Help needed moving from lying on your back to sitting on the side of a flat bed without using bedrails?: A Little Help needed moving to and from a bed to a chair (including a wheelchair)?: A Little Help needed standing up from a chair using your  arms (e.g., wheelchair or bedside chair)?: A Little Help needed to walk in hospital room?: A Little Help needed climbing 3-5 steps with a railing? : Total 6 Click Score: 16    End of Session Equipment Utilized During Treatment: Gait belt Activity Tolerance: Patient tolerated treatment well Patient left: in bed;with nursing/sitter in room Nurse Communication: Mobility status PT Visit Diagnosis: Unsteadiness on feet (R26.81);Other abnormalities of gait and mobility (R26.89);Muscle weakness (generalized) (M62.81)    Time: 4098-1191 PT Time Calculation (min) (ACUTE ONLY): 25 min   Charges:   PT Evaluation $PT Eval Moderate Complexity: 1 Mod PT Treatments $Gait Training: 8-22 mins        Jeanette Walter. Ilsa Iha, PT, DPT 04/30/18, 3:12 PM

## 2018-04-30 NOTE — ED Notes (Signed)
Patient with physical therapy

## 2018-04-30 NOTE — ED Notes (Signed)
Hourly rounding reveals patient sleeping in room. No complaints, stable, in no acute distress. Q15 minute rounds and monitoring via Rover and Officer to continue.  

## 2018-04-30 NOTE — ED Notes (Signed)
Hourly rounding reveals patient in room. Still restless with Tech at bedside. No complaints, stable, in no acute distress. Q15 minute rounds and monitoring via Psychologist, counselling to continue.

## 2018-04-30 NOTE — ED Notes (Signed)
Hourly rounding reveals patient sleeping in room. No complaints, stable, in no acute distress. Q15 minute rounds and monitoring via Security Cameras to continue. 

## 2018-04-30 NOTE — ED Notes (Signed)
Patient made a 1:1 due to being a high fall risk and wandering out of her room into quad area and she keeps persisting to go home. Patient is not easily redirectable due to her confusion and early onset dementia. Patient can be agressive when redirected to go back to her room, she attempts to hit staff. Safety sitter technician is sitting at her bedside.

## 2018-04-30 NOTE — ED Notes (Signed)
Pt. Up from bed, pt unsteady on feet.  Pt. Assisted from bed to reclining chair.

## 2018-04-30 NOTE — ED Notes (Signed)
Patient back in room after walking with physical therapy

## 2018-04-30 NOTE — ED Notes (Signed)
Hourly rounding reveals patient sleeping in room. No complaints, stable, in no acute distress. Q15 minute rounds and monitoring via Security to continue. 

## 2018-04-30 NOTE — ED Notes (Signed)
Patient continues to get in and out of chair, she continues to want to leave her room, 1:1 staffs redirects her back in room. Patient is smiling and laughing with staff. NAD noted.

## 2018-04-30 NOTE — ED Notes (Signed)
Hourly rounding reveals patient in room after going to rest room with assistance. No complaints, stable, in no acute distress. Q15 minute rounds and monitoring via Psychologist, counselling to continue. Pt. Resisting redirection for attempts to leave the Quad.

## 2018-04-30 NOTE — Consult Note (Signed)
[facility] Face-to-Face Psychiatry Consult   Reason for Consult: Altered mental status Referring Physician: [facility] ED MD Patient Identification: Jeanette Walter MRN:  [id number] Principal Diagnosis: Dementia with behavioral disturbance (HCC) Diagnosis:  Principal Problem:   Dementia with behavioral disturbance (HCC)   Total Time spent with patient: 35 min  Subjective:  "I want to go home"  HPI: Jeanette Walter is a 73 y.o. female patient with advanced dementia presented to [facility] ED via EMS on 04/15/2018 for worsening dementia agitation and combativeness and was found trying to run away from her facility.  She has a history of dementia and would not answer questions appropriately.  She has been at times combative.  Her facility has been on lockdown for a suspected coronavirus patient which has agitated her.  She does not have any respiratory symptoms.  The patient is under involuntarily committed (IVC) status during this ED stay for agitation and combativeness from her independent living facility.  She became agitated during a recent "lockdown" at her facility, less agitated when she can wander around--consistent with her dementia diagnosis.  Medications adjusted 04/25/2018: Valproic acid level obtained, 43-below therapeutic threshold- increased BID Depakote 250 mg to TID.    On evaluation, nursing reports that patient continues to be verbally aggressive, and has made attempts to be physically aggressive.  She continues to attempt to leave her room but can be redirectable back to her room at times, however at other times can become aggressive when redirected.  Patient is intermittently compliant with medications, and behavior is more appropriate when she has taken medications.  Patient denies suicidal and homicidal intent.  She denies auditory and visual hallucinations, however this is difficult to assess given her level of dementia. Patient has been compliant and care with physical therapy.  Past Psychiatric  History: Dementia  Risk to Self:  No Risk to Others:  Can be agitated and aggressive when not medication compliant Prior Inpatient Therapy:  Yes Prior Outpatient Therapy:  Yes  Past Medical History:  Past Medical History:  Diagnosis Date  . Dementia (HCC)    History reviewed. No pertinent surgical history. Family History: No family history on file.   Family Psychiatric  History: Patient's mother had a progressive dementia similar to what Jeanette Walter is currently experiencing.  Social History:  Social History   Substance and Sexual Activity  Alcohol Use Not on file     Social History   Substance and Sexual Activity  Drug Use Not on file    Social History   Socioeconomic History  . Marital status: Single    Spouse name: Not on file  . Number of children: Not on file  . Years of education: Not on file  . Highest education level: Not on file  Occupational History  . Not on file  Social Needs  . Financial resource strain: Not on file  . Food insecurity:    Worry: Not on file    Inability: Not on file  . Transportation needs:    Medical: Not on file    Non-medical: Not on file  Tobacco Use  . Smoking status: Never Smoker  . Smokeless tobacco: Never Used  Substance and Sexual Activity  . Alcohol use: Not on file  . Drug use: Not on file  . Sexual activity: Not on file  Lifestyle  . Physical activity:    Days per week: Not on file    Minutes per session: Not on file  . Stress: Not on file  Relationships  .  Social connections:    Talks on phone: Not on file    Gets together: Not on file    Attends religious service: Not on file    Active member of club or organization: Not on file    Attends meetings of clubs or organizations: Not on file    Relationship status: Not on file  Other Topics Concern  . Not on file  Social History Narrative  . Not on file   Additional Social History:  Patient is on able to return to independent living facility, as she needs higher  level of care due to her level of dementia now requires memory care unit in which she is more able to ambulate freely.  Allowing patient to ambulate ad lib. causes significantly less agitation and aggressive behaviors.  Allergies:   Allergies  Allergen Reactions  . Septra [Sulfamethoxazole-Trimethoprim]   . Sulfa Antibiotics     Labs: No results found for this or any previous visit (from the past 48 hour(s)).  Current Facility-Administered Medications  Medication Dose Route Frequency Provider Last Rate Last Dose  . amLODipine (NORVASC) tablet 5 mg  5 mg Oral Daily Loleta Rose, MD   Stopped at 04/27/18 1200  . aspirin chewable tablet 81 mg  81 mg Oral Daily Loleta Rose, MD   Stopped at 04/27/18 1200  . atenolol (TENORMIN) tablet 50 mg  50 mg Oral Daily Loleta Rose, MD   Stopped at 04/27/18 1200  . [START ON 05/01/2018] divalproex (DEPAKOTE SPRINKLE) capsule 750 mg  750 mg Oral QHS Mariel Craft, MD      . donepezil (ARICEPT) tablet 5 mg  5 mg Oral QHS Loleta Rose, MD   5 mg at 04/28/18 2110  . feeding supplement (ENSURE ENLIVE) (ENSURE ENLIVE) liquid 237 mL  237 mL Oral BID BM Charm Rings, NP   237 mL at 04/29/18 1708  . fluticasone (FLONASE) 50 MCG/ACT nasal spray 1 spray  1 spray Each Nare Daily Loleta Rose, MD   1 spray at 04/26/18 1100  . loratadine (CLARITIN) tablet 10 mg  10 mg Oral Daily Loleta Rose, MD   Stopped at 04/27/18 1200  . LORazepam (ATIVAN) tablet 0.5 mg  0.5 mg Oral BID PRN Charm Rings, NP   0.5 mg at 04/25/18 2005   Or  . LORazepam (ATIVAN) injection 0.5 mg  0.5 mg Intramuscular BID PRN Charm Rings, NP   0.5 mg at 04/26/18 1627  . OLANZapine zydis (ZYPREXA) disintegrating tablet 2.5 mg  2.5 mg Oral BID WC Mariel Craft, MD      . OLANZapine zydis [facility]) disintegrating tablet 5 mg  5 mg Oral QHS Mariel Craft, MD      . sertraline (ZOLOFT) tablet 100 mg  100 mg Oral Daily Loleta Rose, MD   Stopped at 04/27/18 1200  . simvastatin  (ZOCOR) tablet 20 mg  20 mg Oral Daily Loleta Rose, MD   20 mg at 04/26/18 1100   Current Outpatient Medications  Medication Sig Dispense Refill  . donepezil (ARICEPT) 10 MG tablet Take 10 mg by mouth Nightly.    . QUEtiapine (SEROQUEL) 25 MG tablet Take three tablets ( 75 mg) at night for one week then decrease to two tablets ( 50 mg) nightly.    . QUEtiapine (SEROQUEL) 50 MG tablet Take one tablet nightly along with 25 mg nightly to equal 75 mg nightly.      Musculoskeletal: Strength & Muscle Tone: normal Gait & Station: good  Patient leans: NA  Psychiatric Specialty Exam: Physical Exam  Nursing note and vitals reviewed. Constitutional: She appears well-developed and well-nourished. She appears distressed.  HENT:  Head: Normocephalic and atraumatic.  Eyes: EOM are normal.  Neck: Normal range of motion.  Cardiovascular: Normal rate and regular rhythm.  Respiratory: Effort normal. No respiratory distress.  Musculoskeletal: Normal range of motion.  Neurological: She is alert.  Psychiatric: Her speech is normal. Her affect is blunt, labile and inappropriate. She is agitated and aggressive. Thought content is paranoid. Cognition and memory are impaired. She expresses impulsivity and inappropriate judgment. She expresses no suicidal plans and no homicidal plans.    Review of Systems  Unable to perform ROS: Mental status change  Psychiatric/Behavioral: Positive for memory loss. Negative for substance abuse.    Blood pressure 123/67, pulse 82, temperature 98.9 F (37.2 C), temperature source Oral, resp. rate 18, SpO2 93 %.There is no height or weight on file to calculate BMI.  General Appearance: Fairly Groomed  Eye Contact:  Fair  Speech:  Clear and Coherent  Volume: Normal  Mood:  Anxious at times  Affect:  blunted  Thought Process:  Coherent at times; sometimes nonsensical  Orientation:  Oriented to self, with prompts is aware of date and location.  Thought Content:  Tangential, perseverative about returning home  Suicidal Thoughts:  No  Homicidal Thoughts:  No  Memory:  Immediate, poor; recent, fair; remote, poor  Judgement:  Impaired  Insight:  Lacking   Psychomotor Activity:  Decreased   Concentration:  Concentration: Poor along with attention  Recall:  Poor   Fund of Knowledge:  Fair  Language:  Fair  Akathisia:  NA  Handed:  Right  AIMS (if indicated):     Assets:  Desire for Improvement Housing Social Support  ADL's:  Impaired  Cognition:  moderate  Sleep:   Fluctuates     Treatment Plan Summary: Daily contact with patient to assess and evaluate symptoms and progress in treatment and Medication management  Dementia with behavioral changes: -Continue Aricept 5 mg daily -Change Depakote Sprinkle to 750 mg at HS to improve compliance (Depakote level was 43, not therapeutic on 04/25/2018) -Discontinue Seroquel 150 mg at bedtime due to ineffectively controlling behaviors and patient noncompliance. Change to Zyprexa 2.5 mg with morning and evening meal, and 5 mg at HS for aggressive behaviors.  Agitation: -Continued Ativan 0.5 mg BID PRN   Depression -Continue Zoloft 100 mg PO daily  Nutritional support  -Continue Chocolate Ensure BID  Disposition: Psychiatrically discharged, social work placement into a Memory Care Center Her dementia appears to be progressing, stable [facility] after agitated behaviors controlled with above medication changes.  Mariel CraftSHEILA M Rein Popov, MD 04/30/2018 10:18 AM

## 2018-04-30 NOTE — ED Notes (Signed)
Hourly rounding reveals patient standing in room. No complaints, stable, in no acute distress. Q15 minute rounds and monitoring via Psychologist, counselling to continue.

## 2018-04-30 NOTE — ED Notes (Signed)
Pt assisted to bedside commode by this tech. Pt laying in bed with eyes closed at this time. No other needs voiced. Will continue to monitor.

## 2018-04-30 NOTE — TOC Progression Note (Signed)
Transition of Care [facility]) - Progression Note    Patient Details  Name: Jeanette Walter MRN: [id number] Date of Birth: 10-19-1945  [facility]) CM/SW Contact  Allayne Butcher, RN Phone Number: 04/30/2018, 11:45 AM  Clinical Narrative:     RNCM contacted by outpatient case manager Tammy with [facility].  Tammy will be looking into placement for this patient.  Tammy was hired by patient's sister Jeanette Walter to assist with placement.  Patient has Medicaid Pending.  Tammy's contact is [phone].  Expected Discharge Plan: Memory Care Barriers to Discharge: Family Issues, Unsafe home situation  Expected Discharge Plan and Services Expected Discharge Plan: Memory Care In-house Referral: Clinical Social Work Discharge Planning Services: CM Consult   Living arrangements for the past 2 months: Assisted Living Facility([facility])                           Social Determinants of Health (SDOH) Interventions    Readmission Risk Interventions No flowsheet data found.
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
## 2018-05-01 MED ORDER — OLANZAPINE 5 MG PO TBDP
10.0000 mg | ORAL_TABLET | Freq: Every day | ORAL | Status: DC
  Administered 2018-05-01 – 2018-05-30 (×26): 10 mg via ORAL
  Filled 2018-05-01 (×26): qty 2

## 2018-05-01 NOTE — ED Notes (Signed)
Hourly rounding reveals patient in room. No complaints, stable, in no acute distress. Q15 minute rounds and monitoring via Rover and Officer to continue.   

## 2018-05-01 NOTE — Progress Notes (Signed)
Brief psychiatry follow-up note.  HPI: Jeanette Walter a 73 y.o.femalepatient with advanced dementia presented to [facility] on 04/15/2018 for worsening dementia agitation and combativeness and was found trying to run away from her facility. She has a history of dementia and would not answer questions appropriately. She has been at times combative. Her facility has been on lock down for a suspected coronavirus patient which has agitated her. She does not have any respiratory symptoms.  The patient isunderinvoluntarily committed (IVC) statusduring thisEDstay for agitation and combativeness from her independent living facility.  She became agitated during a recent "lock down" at her facility, less agitated when she can wander around--consistent with her dementia diagnosis.  Medications adjusted 04/25/2018: Valproic acid level obtained, 43-below therapeutic threshold- increased BID Depakote 250 mg to TID.    On evaluation today, patient has been less verbally and physically aggressive.  She continues to be disoriented to situation.  Nurses reports she did not sleep well overnight and has been sedated off and on through the day.  She did have an episode of fecal incontinence this morning, but was compliant with her morning care.  She has been ambulating with physical therapy.  Patient has been taking medications over the past 24 hours.  We will continue to adjust medications as needed to manage agitation.  Plan to increase Zyprexa Zydis 10 mg at bedtime.  If this is effective, can change daytime doses to as needed.  Patient continues to deny suicidal or homicidal ideation, she denies auditory or visual hallucinations.  She has not appeared to be responding to internal stimuli. Appreciate social work support and placement for memory care.  Mariel Craft, MD

## 2018-05-01 NOTE — ED Notes (Signed)
Checked pt to make sure clean and dry.

## 2018-05-01 NOTE — TOC Progression Note (Signed)
Transition of Care [facility]) - Progression Note    Patient Details  Name: Jeanette Walter MRN: [id number] Date of Birth: 1945-12-01  Transition of Care [facility]) CM/SW Contact  Tania Gennaro Lizotte, LCSW Phone Number: 05/01/2018, 12:12 PM  Clinical Narrative:    Per PT recommendations, pt was recommended for SNF. CSW completed updated FL2, and sent initial referral, and therapy notes to SNFs.    Expected Discharge Plan: Memory Care Barriers to Discharge: Family Issues, Unsafe home situation  Expected Discharge Plan and Services Expected Discharge Plan: Memory Care In-house Referral: Clinical Social Work Discharge Planning Services: CM Consult   Living arrangements for the past 2 months: Assisted Living Facility([facility])                           Social Determinants of Health (SDOH) Interventions    Readmission Risk Interventions No flowsheet data found.
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
## 2018-05-01 NOTE — ED Notes (Signed)
Pt at bedside

## 2018-05-01 NOTE — ED Notes (Addendum)
Breakfast tray placed in patient's room. Pt asleep. Maintained on 15 minute checks and observation by security camera for safety. 1:1 sitter present.

## 2018-05-01 NOTE — ED Notes (Addendum)
Nurse tech and this RN applied depend to patient. Pt was dry. Maintained on 15 minute checks and observation by security camera for safety. 1:1 sitter present.

## 2018-05-01 NOTE — ED Provider Notes (Signed)
-----------------------------------------   6:57 AM on 05/01/2018 -----------------------------------------   Blood pressure (!) 125/59, pulse 83, temperature 97.8 F (36.6 C), temperature source Oral, resp. rate 16, SpO2 94 %.  The patient is calm and cooperative at this time.  There have been no acute events since the last update.  Pending placement.   Irean Hong, MD 05/01/18 [phone]

## 2018-05-01 NOTE — ED Notes (Signed)
Report from Amy RN. Patient sleeping, respirations regular and unlabored. Q15 minute rounds and observation by Rover and Officer to continue.  

## 2018-05-01 NOTE — ED Notes (Addendum)
Pt to the recliner with PT. Medic attempted to assist patient with breakfast. Pt refused the juice and would not open her mouth to eat any biscuit. Pt laughing inappropriately.  Maintained on 15 minute checks and observation by security camera for safety. 1:1 sitter present.

## 2018-05-01 NOTE — ED Notes (Addendum)
Pt incontinent of stool.  Patient washed, scrubs and socks changed. Clean depend put on patient.  Maintained on 15 minute checks and observation by security camera for safety.

## 2018-05-01 NOTE — Progress Notes (Signed)
Physical Therapy Treatment Patient Details Name: Jeanette Walter MRN: [id number] DOB: 11/20/1945 Today's Date: 05/01/2018    History of Present Illness Avaleen Walter is a 73 y.o. female with a history of dementia who presented to the ED on 04/14/2018 for aggressive and combative behavior.  She was brought from [facility] independent living with involuntary commitment paperwork after being found trying to run away from her facility.  She has a history of dementia and was unable to provide a reliable history, so information was obtained through chart review.  Her facility has been on lockdown for a suspected coronavirus patient which appears to have triggered increased agitation. She presented two previous times to the ER for AMS in the month prior to this ER admission. has been awaiting placement in geropsychiatric facility for many days while in the ED and is unable to return home at this time or stay with her sister due to lack of ability to provide the care she needs in these settings. PT was ordered due to concern about decline in function while awating placement. She has been combative at times. Relevant past medical history include dementia, HTN.  Chest radiograh shows chronic bronchitic changes.     PT Comments    Patient tolerated treatment well but is not making significant progress towards goals at this point, second to decreased cognition and increased difficulty with motor control. Patient was sleeping upon arrival. Awoke and cooperated with physical therapy. Confused, laughing, babbling. Referred to objects that did not exist and people and situations that were not relevant to the current situation. Was able to express that she felt drunk and had no pain. Had difficulty following commands but was able to about 50% of the time with additional time for processing. Movement notably more ataxic today and equilibrium more disturbed. Pt was unable to ambulate safely more than 2 feet due to lack of  reliable support from legs. Noted for jerky ataxic movement throughout session. pt transfered sit <> stand x 5 trials at edge of bed with min- mod A for balance with and without RW. Practiced standing balance for several seconds between each transfer trial. Pt with strong backwards left lean that improved with increased standing time. Demo ataxic jerking movements and swaying. Unable to fully gain balance. Pt ambulated 2 feet from bed to chair with mod A to maintain balance. Difficulty initiating movement, unable to properly position RW, legs not fully extended. Not safe to attempt further ambulation today due to difficulty with motor control. In sitting unable to support trunk at first, backward left lean requiring mod A. Progressed to being able to sit on edge of bed with BUE support with supervision. Patient would benefit from continued physical therapy to address remaining impairments and functional limitations to work towards stated goals and return to PLOF or maximal functional independence.    Follow Up Recommendations  SNF     Equipment Recommendations  Rolling walker with 5" wheels    Recommendations for Other Services       Precautions / Restrictions Precautions Precautions: Fall;Other (comment) Precaution Comments: AMS, agitation at times, ataxia Restrictions Weight Bearing Restrictions: No    Mobility  Bed Mobility   Bed Mobility: Supine to Sit     Supine to sit: Mod assist     General bed mobility comments: Patient asleep at start of session and had difficulty initiating movement to get up. Required mod A with scooting to edge of bed once seated.   Transfers Overall transfer level: Needs  assistance Equipment used: Rolling walker (2 wheeled) Transfers: Sit to/from Stand Sit to Stand: Min assist         General transfer comment: pt transfered sit <> stand x 5 trials at edge of bed with min- mod A for balance with and without RW. Pt with strong backwards left lean  that improved with increased standing time. Failed to fully extend knees. Demo ataxic jerking movements and swaying. Unable to fully gain balance. Pt also transfered bed to chair with mod A and RW. DIfficulty moveing feet. Ataxia pronounced.   Ambulation/Gait Ambulation/Gait assistance: Mod assist Gait Distance (Feet): 2 Feet Assistive device: Rolling walker (2 wheeled) Gait Pattern/deviations: Trunk flexed;Staggering left;Staggering right;Shuffle;Ataxic     General Gait Details: Pt ambulated 2 feet from bed to chair with mod A to maintain balance. Difficulty initiating movement, unable to properly position RW, legs not fully extended. Not safe to attempt further ambulation today due to difficulty with motor control.    Stairs             Wheelchair Mobility    Modified Rankin (Stroke Patients Only)       Balance Overall balance assessment: Needs assistance Sitting-balance support: Bilateral upper extremity supported Sitting balance-Leahy Scale: Fair Sitting balance - Comments: unable to support trunk at first, backward left lean requiring mod A. Progressed to being able to sit on edge of bed with BUE support with supervision.    Standing balance support: Bilateral upper extremity supported Standing balance-Leahy Scale: Poor Standing balance comment: unable to maintain balance in standing today without assistance.                             Cognition Arousal/Alertness: Lethargic;Awake/alert(was difficult to wake at start of session but progressed to alert but confused) Behavior During Therapy: Impulsive(laughing, babbling) Overall Cognitive Status: History of cognitive impairments - at baseline                                 General Comments: pt has history of dementia, agitation and being combative. She cooperated with physical therapy during this session but was confused had diffiulty following single commands. She spoke in more complete  sentences today, stating she feels drunk. Seemed to refer to objects that were not there and people who are not present      Exercises Other Exercises Other Exercises: Standing marching with BUE support on RW and min A to maintain balance. x 5 on each foot. Pt with great difficulty initiating movement and was able to lift feet no more than 1 inch off floor. Ataxia noted.     General Comments        Pertinent Vitals/Pain Pain Assessment: No/denies pain    Home Living                      Prior Function            PT Goals (current goals can now be found in the care plan section) Acute Rehab PT Goals PT Goal Formulation: Patient unable to participate in goal setting Progress towards PT goals: PT to reassess next treatment    Frequency    Min 2X/week      PT Plan Current plan remains appropriate    Co-evaluation              AM-PAC PT "6 Clicks" Mobility  Outcome Measure  Help needed turning from your back to your side while in a flat bed without using bedrails?: A Lot Help needed moving from lying on your back to sitting on the side of a flat bed without using bedrails?: A Lot Help needed moving to and from a bed to a chair (including a wheelchair)?: A Lot Help needed standing up from a chair using your arms (e.g., wheelchair or bedside chair)?: A Little Help needed to walk in hospital room?: A Lot Help needed climbing 3-5 steps with a railing? : Total 6 Click Score: 12    End of Session Equipment Utilized During Treatment: Gait belt Activity Tolerance: Patient tolerated treatment well Patient left: with nursing/sitter in room;in chair Nurse Communication: Mobility status(results of session) PT Visit Diagnosis: Unsteadiness on feet (R26.81);Other abnormalities of gait and mobility (R26.89);Muscle weakness (generalized) (M62.81)     Time: 1191-4782 PT Time Calculation (min) (ACUTE ONLY): 25 min  Charges:  $Therapeutic Activity: 23-37 mins                      Luretha Murphy. Ilsa Iha, PT, DPT 05/01/18, 9:39 AM

## 2018-05-01 NOTE — ED Notes (Addendum)
Pt compliant with VS and oral medications. Maintained on 15 minute checks and observation by security camera for safety. 1:1 sitter present.

## 2018-05-01 NOTE — ED Notes (Signed)
Pt. Took pants off in bed and urinated in bed.  Patient clothing and linens changed.  Pt. Advised to let tech or nurse aware when she needed to use bathroom.

## 2018-05-02 NOTE — ED Notes (Signed)
Voluntary/ pending placement 

## 2018-05-02 NOTE — ED Notes (Signed)
Breakfast tray placed at bedside. Pt sleeping at this time.  

## 2018-05-02 NOTE — Progress Notes (Signed)
PT Cancellation Note  Patient Details Name: Jeanette Walter MRN: [id number] DOB: [birth year]   Cancelled Treatment:    Reason Eval/Treat Not Completed: Other (comment)(pt sleeping, nursing suggested coming around 3pm when she is fully awake and most active. Plan to attempt again at that time)   Cira Rue 05/02/2018, 9:46 AM

## 2018-05-02 NOTE — ED Notes (Signed)
Hourly rounding reveals patient in room. No complaints, stable, in no acute distress. Q15 minute rounds and monitoring via Rover and Officer to continue.   

## 2018-05-02 NOTE — ED Notes (Signed)
Hourly rounding reveals patient in room. No complaints, stable, in no acute distress. Q15 minute rounds and monitoring via Security Cameras to continue. 

## 2018-05-02 NOTE — ED Notes (Signed)
Physical therapist came to evaluate Patient, Patient is sleeping and nurse let her know that around 3 pm she wants to walk , so PT states that she will come back around that time to evaluate and treat.

## 2018-05-02 NOTE — ED Notes (Signed)
Pt beding and linen changed by this EDT and RN Dewayne Hatch, pt soiled with loose stool, pt offered toileting and sit on a bedside commode during staff changing her linen, pt provided with warm blanket and pt is resting quietly at this time

## 2018-05-02 NOTE — Progress Notes (Signed)
Physical Therapy Treatment Patient Details Name: Jeanette Walter MRN: [id number] DOB: [birth year] Today's Date: 05/02/2018    History of Present Illness Jeanette Walter is a 73 y.o. female with a history of dementia who presented to the ED on 04/14/2018 for aggressive and combative behavior.  She was brought from [facility] independent living with involuntary commitment paperwork after being found trying to run away from her facility.  She has a history of dementia and was unable to provide a reliable history, so information was obtained through chart review.  Her facility has been on lockdown for a suspected coronavirus patient which appears to have triggered increased agitation. She presented two previous times to the ER for AMS in the month prior to this ER admission. has been awaiting placement in geropsychiatric facility for many days while in the ED and is unable to return home at this time or stay with her sister due to lack of ability to provide the care she needs in these settings. PT was ordered due to concern about decline in function while awating placement. She has been combative at times. Relevant past medical history include dementia, HTN.  Chest radiograh shows chronic bronchitic changes.     PT Comments    Patient tolerated treatment well and is making mild progress towards goals at this point. She was limited by lethargy and refusal to participate at times. She agreed to complete some bed mobility and 4 sit <> stands before refusing to participate further despite education about importance of mobility. Patient demonstrated difficulty with word-finding and expression and became frustrated with this. She was asleep at start of visit and awoke with total assist prone to sit transfer. Initially was unable to control her trunk in sitting but progressed to sitting with BUE support with supervision at edge of bed. Completed sit <> stand to RW with min A x 4 trials at edge of bed before refusing to  perform any more exercise or mobility beyond edge of bed sitting, which she continued to perform for several minutes during education and attempts to encourage patient to participate further. She then initiated return to supine from sitting requiring min A to help with trunk support and lift feet to bed. Patient became frustrated at that point while having difficulty expressing a request and confirmed pain but was unable to indicate location. Calmed down upon reaching supine and stated she had no more needs at this point. Nursing and sitter notified of pt position and results of session. Patient would benefit from continued physical therapy to address remaining impairments and functional limitations to work towards stated goals and return to PLOF or maximal functional independence.    Follow Up Recommendations  SNF     Equipment Recommendations  Rolling walker with 5" wheels    Recommendations for Other Services       Precautions / Restrictions Precautions Precautions: Fall;Other (comment) Precaution Comments: AMS, agitation at times, ataxia Restrictions Weight Bearing Restrictions: No    Mobility  Bed Mobility Overal bed mobility: Needs Assistance Bed Mobility: Supine to Sit;Sit to Supine     Supine to sit: Total assist Sit to supine: Min assist;HOB elevated   General bed mobility comments: Patient lying prone at start of visit. required total A to perform prone to sit. Unable to awake until sitting. Required trunk support at first but became able to sit at edge of bed with supervision with time. Patient initiated return  to supine from sit but required min A to support trunk and  help support BLE. She complained of pain during this transfer but was unable to describe where. She got frustrated with wordfinding difficulties when asking for something during this transfer.   Transfers Overall transfer level: Needs assistance Equipment used: Rolling walker (2 wheeled) Transfers: Sit  to/from Stand Sit to Stand: Min assist         General transfer comment: pt transfered sit <> stand x 4 trials at edge of bed to RW with min A. Patient with improved balance this session but failed to fully extend knees and hips without tactile cuing. Demo occasional ataxic jerking movements. Ataxia less pronounced than last sesison.  Patient refused to continue weight bearing activities after 4 reps.   Ambulation/Gait Ambulation/Gait assistance: (unwilling to attempt this session. )               Stairs             Wheelchair Mobility    Modified Rankin (Stroke Patients Only)       Balance Overall balance assessment: Needs assistance Sitting-balance support: Bilateral upper extremity supported;Feet supported Sitting balance-Leahy Scale: Fair Sitting balance - Comments: unable to support trunk at first, backward right lean requiring mod A. Progressed to being able to sit on edge of bed with BUE support with supervision.  Postural control: Right lateral lean;Posterior lean Standing balance support: Bilateral upper extremity supported Standing balance-Leahy Scale: Fair Standing balance comment: able to balance with CGA a few seconds at a time in standing with BUE support on RW                            Cognition Arousal/Alertness: Lethargic Behavior During Therapy: Agitated(frustrated) Overall Cognitive Status: History of cognitive impairments - at baseline                                 General Comments: pt has history of dementia. Sleeping upon arrival. Upon waking, kept eyes closed most of visit. Had difficulty expressing herself and got frustrated with this. Declined to sit in chair. Refused to continue to perform exercise or sit <> stands after 4 reps. refused to ambulate      Exercises Other Exercises Other Exercises: practiced standing balance at edge of bed with RW for up to 30 seconds at a time x2 trials with min A - CGA.   Other Exercises: practiced sitting on edge of bed for several minuted while attempting to convince pt to participate in further mobility. Patient refused to perform any other activities besides sitting at edge of bed. Educated pt on importance of participating in mobility and weight bearing activities.     General Comments        Pertinent Vitals/Pain Pain Assessment: Faces Faces Pain Scale: Hurts little more Pain Location: unable to describe location, unable to determine otherwise Pain Intervention(s): Monitored during session;Repositioned    Home Living                      Prior Function            PT Goals (current goals can now be found in the care plan section) Acute Rehab PT Goals PT Goal Formulation: Patient unable to participate in goal setting Progress towards PT goals: PT to reassess next treatment    Frequency    Min 2X/week      PT Plan Current plan remains  appropriate    Co-evaluation              AM-PAC PT "6 Clicks" Mobility   Outcome Measure  Help needed turning from your back to your side while in a flat bed without using bedrails?: A Lot Help needed moving from lying on your back to sitting on the side of a flat bed without using bedrails?: A Lot Help needed moving to and from a bed to a chair (including a wheelchair)?: A Lot Help needed standing up from a chair using your arms (e.g., wheelchair or bedside chair)?: A Little Help needed to walk in hospital room?: A Lot Help needed climbing 3-5 steps with a railing? : Total 6 Click Score: 12    End of Session Equipment Utilized During Treatment: Gait belt Activity Tolerance: Patient tolerated treatment well;Patient limited by lethargy;Treatment limited secondary to agitation Patient left: with nursing/sitter in room;in bed Nurse Communication: Mobility status(results of session) PT Visit Diagnosis: Unsteadiness on feet (R26.81);Other abnormalities of gait and mobility  (R26.89);Muscle weakness (generalized) (M62.81)     Time: 2707-8675 PT Time Calculation (min) (ACUTE ONLY): 23 min  Charges:  $Therapeutic Activity: 23-37 mins                     Luretha Murphy. Ilsa Iha, PT, DPT 05/02/18, 3:54 PM

## 2018-05-02 NOTE — ED Notes (Signed)
Nurse got Patient to drink a few sips of water as she was sitting up on the side of the bed with PT, Patient is confused, and noted to be weak, she refuses medications. Nurse will continue to monitor.

## 2018-05-02 NOTE — TOC Progression Note (Signed)
Transition of Care [facility]) - Progression Note    Patient Details  Name: Jeanette Walter MRN: [id number] Date of Birth: 1945-06-14  [facility] of [facility] CM/SW Contact  Allayne Butcher, RN Phone Number: 05/02/2018, 2:25 PM  Clinical Narrative:    Patient's sister Myra contacted RNCM to let her know that she has a face to face Medicaid Application scheduled for 05/13/18.  Patient is still awaiting bed offers for SNF.     Expected Discharge Plan: Skilled Nursing Facility Barriers to Discharge: SNF Pending Medicaid, No SNF bed, SNF Pending bed offer  Expected Discharge Plan and Services Expected Discharge Plan: Skilled Nursing Facility In-house Referral: Clinical Social Work Discharge Planning Services: CM Consult   Living arrangements for the past 2 months: Assisted Living Facility([facility])                           Social Determinants of Health (SDOH) Interventions    Readmission Risk Interventions No flowsheet data found.

## 2018-05-02 NOTE — ED Notes (Signed)
Nurse did get Patient to drink some of her ensure, Patient is confused, saying just random words, nurse will continue to monitor. Patient is safe at this time.

## 2018-05-03 NOTE — ED Provider Notes (Signed)
-----------------------------------------   7:04 AM on 05/03/2018 -----------------------------------------   Blood pressure 128/72, pulse 85, temperature source Oral, resp. rate 18, SpO2 98 %.  The patient is calm and cooperative at this time.  There have been no acute events since the last update.  Pending placement.    Dionne Bucy, MD 05/03/18 [phone]

## 2018-05-03 NOTE — ED Notes (Signed)
Pt drank about half of the Ensure.

## 2018-05-03 NOTE — ED Notes (Signed)
Pt. Had not eaten dinner try.  Pt. Given cup of water with medication.  Pt. Was feed 4 oz of apple sauce.  Pt. Also ate crackers and drank 4 oz of grape juice.

## 2018-05-04 NOTE — ED Notes (Signed)
BEHAVIORAL HEALTH ROUNDING Patient sleeping: Yes.   Patient alert and oriented: eyes closed   Behavior appropriate: Yes.  ; If no, describe:  Nutrition and fluids offered: Yes  Toileting and hygiene offered: sleeping Sitter present: q 15 minute observations and security monitoring Law enforcement present: yes  ODS 

## 2018-05-04 NOTE — ED Notes (Addendum)
Report to include Situation, Background, Assessment, and Recommendations received from [facility] RN. Patient alert, warm and dry, in no acute distress. Patient refuses to answer questions related to SI, HI, AVH and pain. Patient made aware of Q15 minute rounds and Psychologist, counselling presence for their safety. Patient instructed to come to me with needs or concerns.

## 2018-05-04 NOTE — ED Notes (Signed)
Hourly rounding reveals patient sleeping in room. No complaints, stable, in no acute distress. Q15 minute rounds and monitoring via Rover and Officer to continue.  

## 2018-05-04 NOTE — ED Notes (Signed)
Patient observed lying in bed with eyes closed  Even, unlabored respirations observed   NAD observed   I will continue to monitor along with every 15 minute visual observations and ongoing security monitoring

## 2018-05-04 NOTE — ED Notes (Signed)
BEHAVIORAL HEALTH ROUNDING Patient sleeping: Yes.   Patient alert and oriented: eyes closed   Behavior appropriate: Yes.  ; If no, describe:  Nutrition and fluids offered: Yes  Toileting and hygiene offered: sleeping Sitter present: q 15 minute observations and security monitoring Law enforcement present: yes  ODS

## 2018-05-04 NOTE — ED Notes (Signed)
Hourly rounding reveals patient in room with tech helping patient to bedside commode. No complaints, stable, in no acute distress. Q15 minute rounds and monitoring via Psychologist, counselling to continue.

## 2018-05-04 NOTE — ED Notes (Signed)
Patient would not turn over to check.

## 2018-05-04 NOTE — ED Notes (Signed)
Assisted by nurse Susy Frizzle) with cleaning and changed soiled brief. Patient bottom red and raw, applied barrier cream before putting on brief.AS

## 2018-05-04 NOTE — ED Notes (Signed)
BEHAVIORAL HEALTH ROUNDING Patient sleeping: Yes.   Patient alert and oriented: eyes closed   Behavior appropriate: Yes.  ; If no, describe:  Nutrition and fluids offered: Yes  Toileting and hygiene offered: sleeping Sitter present: q 15 minute observations and security monitoring Law enforcement present: yes  ODS  ENVIRONMENTAL ASSESSMENT Potentially harmful objects out of patient reach: Yes.   Personal belongings secured: Yes.   Patient dressed in hospital provided attire only: Yes.   Plastic bags out of patient reach: Yes.   Patient care equipment (cords, cables, call bells, lines, and drains) shortened, removed, or accounted for: Yes.   Equipment and supplies removed from bottom of stretcher: Yes.   Potentially toxic materials out of patient reach: Yes.   Sharps container removed or out of patient reach: Yes.

## 2018-05-04 NOTE — ED Notes (Signed)
Full set of VS including BP to be taken when she awakens more  Continue to monitor

## 2018-05-04 NOTE — ED Notes (Signed)
Assisted patient in getting on bedside commode. Gave patient a bedside bath and changed brief and scrubs. Assisted in feeding her applesauce and graham crackers and water. AS

## 2018-05-04 NOTE — ED Notes (Signed)
BEHAVIORAL HEALTH ROUNDING Patient sleeping: Yes.   Patient alert and oriented: eyes closed  Appears to be asleep Behavior appropriate: Yes.  ; If no, describe:  Nutrition and fluids offered: Yes  Toileting and hygiene offered: sleeping Sitter present: q 15 minute observations and security monitoring Law enforcement present: yes  ODS 

## 2018-05-04 NOTE — ED Notes (Signed)
voluntary 

## 2018-05-04 NOTE — ED Notes (Signed)
Patient dry at this time 

## 2018-05-04 NOTE — ED Provider Notes (Signed)
-----------------------------------------   7:10 AM on 05/04/2018 -----------------------------------------   BP 125/69 (BP Location: Left Arm)   Pulse 78   Temp 98.1 F (36.7 C) (Oral)   Resp 18   SpO2 96%   No acute events overnight.   Disposition is pending per Psychiatry/Behavioral Medicine team recommendations.     Phineas Semen, MD 05/04/18 [phone]

## 2018-05-04 NOTE — ED Notes (Signed)
Pt. Had soiled undergarment.  Pt. Changed into new undergarment.  Pt. Had some redness between buttocks.  Barrier cream applied, pt. Felt relief.  Pt. Made comfortable.

## 2018-05-04 NOTE — ED Notes (Signed)
Patient still dry at this time.

## 2018-05-05 NOTE — ED Notes (Signed)
Hourly rounding reveals patient sleeping in room. No complaints, stable, in no acute distress. Q15 minute rounds and monitoring via Rover and Officer to continue.  

## 2018-05-05 NOTE — ED Notes (Addendum)
Medication given in applesauce.

## 2018-05-05 NOTE — ED Notes (Signed)
Vol/Pending Placement  

## 2018-05-05 NOTE — ED Notes (Signed)
Pt sleeping. Could not arouse enough to give medications safely.

## 2018-05-05 NOTE — ED Notes (Signed)
Pt taken to restroom / depend changed. Given lunch tray and Ensure. Maintained on 15 minute checks and observation by security camera for safety.

## 2018-05-05 NOTE — TOC Progression Note (Signed)
Transition of Care [facility]) - Progression Note    Patient Details  Name: Jeanette Walter MRN: [id number] Date of Birth: [birth year]  Transition of [facility] CM/SW Contact  Tania Jessamine Barcia, LCSW Phone Number: 05/05/2018, 12:09 PM  Clinical Narrative:    CSW continuing to wait for bed offers for SNF.    Expected Discharge Plan: Skilled Nursing Facility Barriers to Discharge: SNF Pending Medicaid, No SNF bed, SNF Pending bed offer  Expected Discharge Plan and Services Expected Discharge Plan: Skilled Nursing Facility In-house Referral: Clinical Social Work Discharge Planning Services: CM Consult   Living arrangements for the past 2 months: Assisted Living Facility([facility])                           Social Determinants of Health (SDOH) Interventions    Readmission Risk Interventions No flowsheet data found.

## 2018-05-05 NOTE — ED Notes (Signed)
Report to include Situation, Background, Assessment, and Recommendations received from Amy RN. Patient alert and oriented, warm and dry, in no acute distress. Patient denies SI, HI, AVH and pain. Patient made aware of Q15 minute rounds and Rover and Officer presence for their safety. Patient instructed to come to me with needs or concerns.   

## 2018-05-05 NOTE — ED Provider Notes (Signed)
-----------------------------------------   3:51 AM on 05/05/2018 -----------------------------------------   Blood pressure 130/78, pulse 94, temperature 97.9 F (36.6 C), temperature source Oral, resp. rate 18, SpO2 93 %.  The patient is calm and cooperative at this time.  There have been no acute events since the last update.  Awaiting disposition plan from Behavioral Medicine and/or social work teams.   Loleta Rose, MD 05/05/18 [phone]

## 2018-05-06 NOTE — ED Notes (Signed)
Hourly rounding reveals patient in room. No complaints, stable, in no acute distress. Q15 minute rounds and monitoring via Rover and Officer to continue.   

## 2018-05-06 NOTE — ED Notes (Signed)
Pt has consumed approx 20% of meal tray.

## 2018-05-06 NOTE — ED Notes (Signed)
Snack and beverage given. 

## 2018-05-06 NOTE — ED Notes (Signed)
Report to kim, rn.  

## 2018-05-06 NOTE — ED Notes (Signed)

## 2018-05-06 NOTE — ED Notes (Signed)
Pt sitting up in recliner chair, eating banana, sandwich, chips and drinking water. Pt has consumed an entire bottle of vanilla ensure since sitting in chair.

## 2018-05-06 NOTE — ED Notes (Signed)
Pt drank whole chocolate ensure, now sitting down to eat lunch tray.

## 2018-05-06 NOTE — ED Notes (Signed)
BEHAVIORAL HEALTH ROUNDING Patient sleeping: Yes.   Patient alert and oriented: eyes closed  Appears to be asleep Behavior appropriate: Yes.  ; If no, describe:  Nutrition and fluids offered: Yes  Toileting and hygiene offered: sleeping Sitter present: q 15 minute observations and security monitoring Law enforcement present: yes  ODS 

## 2018-05-06 NOTE — ED Provider Notes (Signed)
-----------------------------------------   4:21 AM on 05/06/2018 -----------------------------------------   Blood pressure 135/81, pulse 96, temperature 97.8 F (36.6 C), temperature source Oral, resp. rate 18, SpO2 93 %.  The patient is calm and no issues overnight.  Resting comfortably this time.  Still pending final disposition plan    Sharyn Creamer, MD 05/06/18 [phone]

## 2018-05-06 NOTE — ED Notes (Signed)
Vol/Pending Placement  

## 2018-05-06 NOTE — ED Notes (Signed)
Pt provided with meal tray by dave, ed tech.

## 2018-05-06 NOTE — ED Notes (Signed)
Patient dry at this time 

## 2018-05-06 NOTE — ED Notes (Signed)
PT, Jeanette Walter here to work with pt.

## 2018-05-06 NOTE — ED Notes (Signed)
BEHAVIORAL HEALTH ROUNDING Patient sleeping: Yes.   Patient alert and oriented: eyes closed   Behavior appropriate: Yes.  ; If no, describe:  Nutrition and fluids offered: Yes  Toileting and hygiene offered: sleeping Sitter present: q 15 minute observations and security monitoring Law enforcement present: yes  ODS 

## 2018-05-06 NOTE — ED Notes (Signed)
Report from amy, rn.  

## 2018-05-06 NOTE — ED Notes (Signed)
Pt states she would like to sit in the recliner chair longer.

## 2018-05-06 NOTE — ED Notes (Signed)
Pt assisted back to bed. Pt has consumed approx 10% of dinner tray and juice. Pants currently dry. Pt declines need to use restroom.

## 2018-05-06 NOTE — ED Notes (Signed)
Pt ambulating in hall with PT.

## 2018-05-06 NOTE — Progress Notes (Signed)
Physical Therapy Treatment Patient Details Name: Jeanette Walter MRN: [id number] DOB: Oct 17, 1945 Today's Date: 05/06/2018    History of Present Illness Jeanette Walter is a 73 y.o. female with a history of dementia who presented to the ED on 04/14/2018 for aggressive and combative behavior.  She was brought from [facility] independent living with involuntary commitment paperwork after being found trying to run away from her facility.  She has a history of dementia and was unable to provide a reliable history, so information was obtained through chart review.  Her facility has been on lockdown for a suspected coronavirus patient which appears to have triggered increased agitation. She presented two previous times to the ER for AMS in the month prior to this ER admission. has been awaiting placement in geropsychiatric facility for many days while in the ED and is unable to return home at this time or stay with her sister due to lack of ability to provide the care she needs in these settings. PT was ordered due to concern about decline in function while awating placement. She has been combative at times. Relevant past medical history include dementia, HTN.  Chest radiograh shows chronic bronchitic changes.     PT Comments    Patient tolerated treatment well and is again making some progress towards goals. She was more alert today and communicative in phrases. She demonstrated less ataxia than at previous visits and was able to ambulate safely in the hall for about 200 feet with RW and min A to guide AD and prevent loss of balance. Patient fatigued quickly and requested to return to her room, trying to lay her trunk down on the RW. She was able to get back to her room with encouragement. Post ambulation HR was 123 bpm and O2 sat 98% on RA. Patient continues to be confused that causes difficulty in participation. She could not understand how to properly position or use RW and required min A to prevent her from  pushing it too far from body, gripping it inappropriately, and running into people and objects in hallway. After ambulation she requested to eat so remainder of session was spent practicing problem and manual activities surrounding eating and drinking while maintaining seated position. Patient required assistance to recognize and appropriately respond to opportunity to eat and drink and required assistance to handle food and drink without dropping it. Patient would benefit from continued physical therapy to address remaining impairments and functional limitations to work towards stated goals and return to PLOF or maximal functional independence.    Follow Up Recommendations  SNF     Equipment Recommendations  Rolling walker with 5" wheels    Recommendations for Other Services       Precautions / Restrictions Precautions Precautions: Fall;Other (comment) Precaution Comments: AMS, agitation at times, ataxia Restrictions Weight Bearing Restrictions: No    Mobility  Bed Mobility Overal bed mobility: Needs Assistance Bed Mobility: Supine to Sit     Supine to sit: Total assist     General bed mobility comments: Patient sound asleep upon start of visit and required total assist to perform supine to sit to help wake her up. Required trunk support initially but became able to sit at edge of bed with feet supported and hands in lap for several minutes before standing.   Transfers Overall transfer level: Needs assistance Equipment used: Rolling walker (2 wheeled) Transfers: Sit to/from Stand Sit to Stand: Min assist         General transfer comment: Patient  performed sit <> stand to RW with min A to prevent falling and improve use of AD. first rep at edge of bed. Second rep transfered to chair. Ataxia very mild this session.   Ambulation/Gait Ambulation/Gait assistance: Min assist   Assistive device: Rolling walker (2 wheeled) Gait Pattern/deviations: Trunk flexed;Staggering  right;Staggering left Gait velocity: decreased   General Gait Details: pt ambulated approximately 200 feet with RW and min A to help guide walker and support pt when she loses balance. Patient confused about use of RW and required multimodal cuing and physical assist to guide it and prevent hitting other people and objects in the hall, turn without falling, and keep walker close enough to body to prevent loss of balance. patient maintained far RW far from body and resisted linician with 80% of attempts to move it closer.    Stairs             Wheelchair Mobility    Modified Rankin (Stroke Patients Only)       Balance Overall balance assessment: Needs assistance Sitting-balance support: Bilateral upper extremity supported;Feet supported Sitting balance-Leahy Scale: Good Sitting balance - Comments: unable to support trunk initially with sitting while sleepy. Progressed to able to sit without external support and shift weight.  Postural control: Right lateral lean Standing balance support: Bilateral upper extremity supported Standing balance-Leahy Scale: Fair Standing balance comment: Requires min A for balance while ambulating with RW.                             Cognition Arousal/Alertness: Lethargic;Awake/alert Behavior During Therapy: Flat affect;Impulsive(confused, occasionally frustrated, occasionally thankful) Overall Cognitive Status: History of cognitive impairments - at baseline                                 General Comments: pt has history of dementia. Sleeping soundly upon arrival. Required total assist to sit up to awake. Became more alert throughout session and was able to speak in phrases at times. Difficulty with word finding and expressing herself      Exercises Other Exercises Other Exercises: practiced sitting upright in chair with occasional leans forward while attempting to eat and drink. Required assistance to recognize food and  respond appropriately to the situation, get drink and food to mouth.     General Comments        Pertinent Vitals/Pain Pain Assessment: Faces Faces Pain Scale: No hurt    Home Living                      Prior Function            PT Goals (current goals can now be found in the care plan section) Acute Rehab PT Goals PT Goal Formulation: Patient unable to participate in goal setting Progress towards PT goals: PT to reassess next treatment    Frequency    Min 2X/week      PT Plan Current plan remains appropriate    Co-evaluation              AM-PAC PT "6 Clicks" Mobility   Outcome Measure  Help needed turning from your back to your side while in a flat bed without using bedrails?: A Lot Help needed moving from lying on your back to sitting on the side of a flat bed without using bedrails?: A Lot Help needed moving to  and from a bed to a chair (including a wheelchair)?: A Lot Help needed standing up from a chair using your arms (e.g., wheelchair or bedside chair)?: A Little Help needed to walk in hospital room?: A Little Help needed climbing 3-5 steps with a railing? : Total 6 Click Score: 13    End of Session Equipment Utilized During Treatment: Gait belt Activity Tolerance: Patient tolerated treatment well;Patient limited by lethargy;Other (comment);Patient limited by fatigue(treatment limited by attention span) Patient left: in chair(sitter outside of room within eye contact) Nurse Communication: Mobility status(results of session; communicated about food and drink appropriate for pt) PT Visit Diagnosis: Unsteadiness on feet (R26.81);Other abnormalities of gait and mobility (R26.89);Muscle weakness (generalized) (M62.81)     Time: 1545-1610 PT Time Calculation (min) (ACUTE ONLY): 25 min  Charges:  $Gait Training: 8-22 mins $Therapeutic Activity: 8-22 mins                     Luretha MurphySara R. Ilsa IhaSnyder, PT, DPT 05/06/18, 5:15 PM

## 2018-05-06 NOTE — ED Notes (Signed)
Patient still dry at this time nurse April in room getting v-sign at this time.

## 2018-05-06 NOTE — ED Notes (Signed)
Patient was changed by [facility]. Patient had a bowel movement.

## 2018-05-06 NOTE — ED Notes (Signed)
Patient observed lying in bed with eyes closed  Even, unlabored respirations observed   NAD observed  I will continue to monitor along with every 15 minute visual observations and ongoing security camera monitoring

## 2018-05-07 NOTE — ED Notes (Signed)
Pt walked from her room to the room across the hall and laid down on the bed.

## 2018-05-07 NOTE — ED Notes (Signed)
Pt. Requested and was given meal tray and drink. 

## 2018-05-07 NOTE — ED Notes (Signed)
Pt had a bowel movement.  Diaper was changed.

## 2018-05-07 NOTE — ED Notes (Signed)
Vol/Pending Placement  

## 2018-05-07 NOTE — ED Notes (Signed)
Pt placed from recliner back into bed.  Pt's brief checked.  Pt's brief dry.  Pt fed applesauce.

## 2018-05-07 NOTE — ED Provider Notes (Signed)
-----------------------------------------   6:15 AM on 05/07/2018 -----------------------------------------   Blood pressure 122/63, pulse 87, temperature (!) 97.5 F (36.4 C), temperature source Axillary, resp. rate 14, SpO2 96 %.  The patient is sleeping at this time.  There have been no acute events since the last update.  Awaiting disposition plan from social work team.   Irean Hong, MD 05/07/18 [phone]

## 2018-05-07 NOTE — ED Notes (Signed)
Vol pending social work placement 

## 2018-05-07 NOTE — Progress Notes (Signed)
Physical Therapy Treatment Patient Details Name: Jeanette Walter MRN: [id number] DOB: May 09, 1945 Today's Date: 05/07/2018    History of Present Illness Jeanette Walter is a 73 y.o. female with a history of dementia who presented to the ED on 04/14/2018 for aggressive and combative behavior.  She was brought from [facility] independent living with involuntary commitment paperwork after being found trying to run away from her facility.  She has a history of dementia and was unable to provide a reliable history, so information was obtained through chart review.  Her facility has been on lockdown for a suspected coronavirus patient which appears to have triggered increased agitation. She presented two previous times to the ER for AMS in the month prior to this ER admission. has been awaiting placement in geropsychiatric facility for many days while in the ED and is unable to return home at this time or stay with her sister due to lack of ability to provide the care she needs in these settings. PT was ordered due to concern about decline in function while awating placement. She has been combative at times. Relevant past medical history include dementia, HTN.  Chest radiograh shows chronic bronchitic changes.     PT Comments    Patient confused, drowsy, and required maximal encouragement to participate in treatment today. Patient tolerated well but is not making progress towards goals at this point. Her motivation and cognitive abilities fluctuate daily and today she had difficulty participating this date. Easily frustrated with difficulty word finding or expressing herself. Patient sound asleep upon start of visit and required total assist to perform supine to sit to help wake her up. Required trunk support initially but became able to sit at edge of bed with feet and hands supported before standing. Patient performed sit <> stand to RW with mod A +2 to prevent falling and improve use of AD. Failed to fully extend  hips and knees or push through RW adequately. Able to stand while breif was changed. Stand pivot transfer mod A bed to chair. Confusion and difficulty following commands notable for all transfers. attempted eating/drinking in seated position in chair with back supported. Patient trying to sleep otherwise. Apraxia and confusion noted. Fiddling with blanket and trying to pull over head. Settled when blanket pulled around shoulders. Drank 1/4 of a chocolate ensure with assistance. Would not participate in upright sitting. Required constant encouragement to participate. Patient would benefit from continued physical therapy to address remaining impairments and functional limitations to work towards stated goals and return to PLOF or maximal functional independence.    Follow Up Recommendations  SNF     Equipment Recommendations  Rolling walker with 5" wheels    Recommendations for Other Services       Precautions / Restrictions Precautions Precautions: Fall;Other (comment) Precaution Comments: AMS, agitation at times, ataxia Restrictions Weight Bearing Restrictions: No    Mobility  Bed Mobility Overal bed mobility: Needs Assistance Bed Mobility: Supine to Sit     Supine to sit: Total assist     General bed mobility comments: Patient sound asleep upon start of visit and required total assist to perform supine to sit to help wake her up. Required trunk support initially but became able to sit at edge of bed with feet supported and hands in lap for several minutes before standing.   Transfers Overall transfer level: Needs assistance Equipment used: Rolling walker (2 wheeled) Transfers: Sit to/from [employer] Sit to Stand: Mod assist Stand pivot transfers: Mod assist;+2  physical assistance       General transfer comment: Patient performed sit <> stand to RW with mod A  +2 to prevent falling and improve use of AD. Failed to fully extend hips and knees or push through RW  adequately. Able to stand while breif was changed. Stand pivot transfer mod A bed to chair. Confusion and difficulty following commands notable for all transfers.    Ambulation/Gait Ambulation/Gait assistance: (unable/unwilling this date; patient confused and keeps trying to return to supine to sleep)               Stairs             Wheelchair Mobility    Modified Rankin (Stroke Patients Only)       Balance Overall balance assessment: Needs assistance Sitting-balance support: Bilateral upper extremity supported;Feet supported Sitting balance-Leahy Scale: Fair Sitting balance - Comments: unable to support trunk initially with sitting while sleepy. Progressed to able to sit without external support with intermittant attempts to return to supine.  Postural control: Right lateral lean Standing balance support: Bilateral upper extremity supported Standing balance-Leahy Scale: Poor Standing balance comment: Requires mod A for balance while standing at edge of bed with RW getting breif changed.                             Cognition Arousal/Alertness: Lethargic Behavior During Therapy: (confused, frustrated) Overall Cognitive Status: History of cognitive impairments - at baseline(no family/caregiver present to determine baseline)                                 General Comments: pt has history of dementia. Sleeping soundly upon arrival. Required total assist to sit up to awake. Became more alert and easily frustrated throughout session. States she doesn't feel good and wants to go back to sleep. Per nursing she has been sleeping almost constantly      Exercises Other Exercises Other Exercises: practiced sitting on edge of bed with interimittant min A to prevent loss of trunk stability while donning new pants and while encouraging patient to participate in transfers and ambulation.  Other Exercises: practiced standing at RW with mod A for over 1 min  while nursing changed wet breif.  Other Exercises: attempted eating/drinking in seated position in chair with back supported. Patient trying to sleep otherwise. Apraxia and confusion noted. Fiddling with blanket and trying to pull over head. Settled when blanket pulled around shoulders. Drank 1/4 of a chocolate ensure with assistance. Would not participate in upright sitting. Required constant encouragement to participate.     General Comments        Pertinent Vitals/Pain      Home Living                      Prior Function            PT Goals (current goals can now be found in the care plan section)      Frequency    Min 2X/week      PT Plan Current plan remains appropriate    Co-evaluation              AM-PAC PT "6 Clicks" Mobility   Outcome Measure  Help needed turning from your back to your side while in a flat bed without using bedrails?: A Lot Help needed moving from lying  on your back to sitting on the side of a flat bed without using bedrails?: A Lot Help needed moving to and from a bed to a chair (including a wheelchair)?: A Lot Help needed standing up from a chair using your arms (e.g., wheelchair or bedside chair)?: A Lot Help needed to walk in hospital room?: Total Help needed climbing 3-5 steps with a railing? : Total 6 Click Score: 10    End of Session Equipment Utilized During Treatment: Gait belt Activity Tolerance: Patient tolerated treatment well;Patient limited by lethargy;Other (comment);Patient limited by fatigue(treatment limited by attention span) Patient left: in chair;Other (comment)(sitter outside of room, notified of pt's position and states will take responsibility) Nurse Communication: Mobility status(results of session; communicated about food and drink appropriate for pt) PT Visit Diagnosis: Unsteadiness on feet (R26.81);Other abnormalities of gait and mobility (R26.89);Muscle weakness (generalized) (M62.81)     Time:  1530-1600 PT Time Calculation (min) (ACUTE ONLY): 30 min  Charges:  $Therapeutic Activity: 23-37 mins                    Luretha MurphySara R. Ilsa IhaSnyder, PT, DPT 05/07/18, 4:31 PM

## 2018-05-07 NOTE — ED Notes (Signed)
Pt drank a a couple sips of the Ensure.

## 2018-05-07 NOTE — ED Notes (Signed)
Pt. Still appears confused and will not answer questions appropriately.  Pt. Can be redirected with some effort.  Pt. Is calm at this time.

## 2018-05-08 NOTE — ED Notes (Signed)
Hourly rounding reveals patient sleeping in room. No complaints, stable, in no acute distress. Q15 minute rounds and monitoring via Security to continue. 

## 2018-05-08 NOTE — ED Notes (Signed)
Patient in room walking around helped patient to get in chair and placed blanket over patient at this time ,patient now watching tv .

## 2018-05-08 NOTE — Progress Notes (Signed)
Physical Therapy Treatment Patient Details Name: Jeanette Walter MRN: [id number] DOB: 1945-03-23 Today's Date: 05/08/2018    History of Present Illness Jeanette Walter is a 73 y.o. female with a history of dementia who presented to the ED on 04/14/2018 for aggressive and combative behavior.  She was brought from [facility] independent living with involuntary commitment paperwork after being found trying to run away from her facility.  She has a history of dementia and was unable to provide a reliable history, so information was obtained through chart review.  Her facility has been on lockdown for a suspected coronavirus patient which appears to have triggered increased agitation. She presented two previous times to the ER for AMS in the month prior to this ER admission. has been awaiting placement in geropsychiatric facility for many days while in the ED and is unable to return home at this time or stay with her sister due to lack of ability to provide the care she needs in these settings. PT was ordered due to concern about decline in function while awating placement. She has been combative at times. Relevant past medical history include dementia, HTN.  Chest radiograh shows chronic bronchitic changes.     PT Comments    Patient with nursing/sitter in room upon PT entering. Has been ambulating in room with nursing. Patient inconsistent with command follow however does demonstrate purposeful movement with supervision/CGA as seen in ability to ambulate and transfer. Turning requires occasional touching to bed/wall for support however could also be potentially due to patient confusion requiring re-orientation to task. Patient responded well to word "zoom" to walk faster and redirect from attempt to walk to door. Patient challenged with seated interventions due to inconsistent command follow, only demonstrating ability to perform task she wanted to do. Current POC remains appropriate as patient will need  supervision for mobility due to cognitive limitations.     Follow Up Recommendations  SNF     Equipment Recommendations       Recommendations for Other Services       Precautions / Restrictions Precautions Precautions: Fall;Other (comment) Precaution Comments: AMS, agitation at times, ataxia Restrictions Weight Bearing Restrictions: No    Mobility  Bed Mobility                  Transfers Overall transfer level: Modified independent   Transfers: Sit to/from Stand Sit to Stand: Min guard         General transfer comment: Patient performed sit to stand from chair with CGA, no use of AD required.   Ambulation/Gait Ambulation/Gait assistance: Min guard Gait Distance (Feet): 40 Feet Assistive device: None   Gait velocity: decreased but functional    General Gait Details: Patient ambulated within room, turning, and changing speeds with occasional touch to wall/bed for support. Ambulated without AD, kept to room due to attempting to leave. Has been ambulating with nursing without AD in room.    Stairs             Wheelchair Mobility    Modified Rankin (Stroke Patients Only)       Balance Overall balance assessment: Needs assistance Sitting-balance support: Feet supported Sitting balance-Leahy Scale: Fair Sitting balance - Comments: talks with arm movements without LOB    Standing balance support: No upper extremity supported;During functional activity Standing balance-Leahy Scale: Fair Standing balance comment: requires occasional touches to wall/bed for stability when turning, requires supervision due to confusion resulting in occasional instability from patient forgetting task at hand.  Cognition Arousal/Alertness: Awake/alert Behavior During Therapy: WFL for tasks assessed/performed Overall Cognitive Status: History of cognitive impairments - at baseline                                  General Comments: Patient sitting in chair upon arrival. Confused, intermittantly able to follow simple/complex commands       Exercises Other Exercises Other Exercises: ambulation without AD, practice turning and changing speeds , patient responds to change of speeds by saying "zoom" , requires redirection due to attempting to leave room. transferred from chair with CGA, scooted forward and backwards in chair independently  Other Exercises: unable to perform seated exercises due to patient's inconsistant command following. Patient challenged with task orientation.     General Comments General comments (skin integrity, edema, etc.): Patient inconsistant with command follow. Limited muscle mass of body noted.       Pertinent Vitals/Pain Pain Assessment: Faces Faces Pain Scale: No hurt    Home Living                      Prior Function            PT Goals (current goals can now be found in the care plan section) Acute Rehab PT Goals PT Goal Formulation: Patient unable to participate in goal setting    Frequency    Min 2X/week      PT Plan Current plan remains appropriate    Co-evaluation              AM-PAC PT "6 Clicks" Mobility   Outcome Measure  Help needed turning from your back to your side while in a flat bed without using bedrails?: A Little Help needed moving from lying on your back to sitting on the side of a flat bed without using bedrails?: A Lot Help needed moving to and from a bed to a chair (including a wheelchair)?: A Little Help needed standing up from a chair using your arms (e.g., wheelchair or bedside chair)?: A Little Help needed to walk in hospital room?: A Little Help needed climbing 3-5 steps with a railing? : A Lot 6 Click Score: 16    End of Session Equipment Utilized During Treatment: Gait belt Activity Tolerance: Patient tolerated treatment well;Other (comment)(confusion ) Patient left: in chair;with nursing/sitter in  room Nurse Communication: Mobility status PT Visit Diagnosis: Unsteadiness on feet (R26.81);Other abnormalities of gait and mobility (R26.89);Muscle weakness (generalized) (M62.81)     Time: [id number] PT Time Calculation (min) (ACUTE ONLY): 16 min  Charges:  $Gait Training: 8-22 mins                     Precious BardMarina Mikael Debell, PT, DPT     Precious BardMarina Baillie Mohammad 05/08/2018, 4:28 PM

## 2018-05-08 NOTE — ED Notes (Signed)
Vol/Pending placement  

## 2018-05-08 NOTE — ED Notes (Signed)
Pt was offered a meal and assisted with eating, pt ate 10 percent of meal.

## 2018-05-08 NOTE — ED Notes (Signed)
Patient continues to be restless and wants to walk out of her room

## 2018-05-08 NOTE — ED Notes (Signed)
Pt. Ate half of chocolate ice cream serving with medication.  Pt. Also drank about 6 oz of water.

## 2018-05-08 NOTE — ED Notes (Signed)
Patient drank whole cup of  chocolate Ensure

## 2018-05-08 NOTE — ED Notes (Signed)
Hourly rounding reveals patient in room. No complaints, stable, in no acute distress. Q15 minute rounds and monitoring via Rover and Officer to continue.   

## 2018-05-08 NOTE — ED Notes (Signed)
VOL  PENDING  PLACEMENT 

## 2018-05-08 NOTE — Progress Notes (Signed)
CSW contacted pt's case manager Tammy to see if she had any leads, and left a voicemail.   CSW resent bed requests to the following facilities:   [facility]  [facility]  [facility]  [facility]  [facility]  [facility]     [name]  [facility]  [phone]

## 2018-05-08 NOTE — ED Notes (Signed)
Patient walking around room with physical therapy, patient is ambulatory, with gait belt on, NAD noted

## 2018-05-08 NOTE — ED Provider Notes (Signed)
-----------------------------------------   4:49 AM on 05/08/2018 -----------------------------------------   Blood pressure 139/75, pulse 94, temperature (!) 97.5 F (36.4 C), temperature source Axillary, resp. rate 16, SpO2 96 %.  The patient is calm and cooperative at this time.  There have been no acute events since the last update.  Awaiting disposition plan from social work    Arnaldo Natal, MD 05/08/18 [phone]

## 2018-05-08 NOTE — ED Notes (Signed)
Report from Jadeka RN. Patient sleeping, respirations regular and unlabored. Q15 minute rounds and observation by Rover and Officer to continue. 

## 2018-05-08 NOTE — ED Notes (Signed)
Patient was found laying on the floor, face down with her he

## 2018-05-09 NOTE — ED Notes (Signed)
BEHAVIORAL HEALTH ROUNDING Patient sleeping: Yes.   Patient alert and oriented: eyes closed  Appears to be asleep Behavior appropriate: Yes.  ; If no, describe:  Nutrition and fluids offered: Yes  Toileting and hygiene offered: sleeping Sitter present: q 15 minute observations and security monitoring Law enforcement present: yes  ODS 

## 2018-05-09 NOTE — ED Notes (Signed)
Patient observed lying in bed with eyes closed  Even, unlabored respirations observed   NAD assessed   I will continue to monitor along with every 15 minute visual observations and ongoing security monitoring    

## 2018-05-09 NOTE — ED Provider Notes (Signed)
-----------------------------------------   7:51 AM on 05/09/2018 -----------------------------------------   Blood pressure (!) 112/55, pulse 78, temperature 98.3 F (36.8 C), temperature source Axillary, resp. rate 16, SpO2 96 %.  The patient is calm and cooperative at this time.  There have been no acute events since the last update.  Awaiting disposition plan from Behavioral Medicine team.    Arnaldo Natal, MD 05/09/18 [phone]

## 2018-05-09 NOTE — ED Notes (Signed)
Hourly rounding reveals patient in room. No complaints, stable, in no acute distress. Q15 minute rounds and monitoring via Rover and Officer to continue.   

## 2018-05-09 NOTE — ED Notes (Signed)
VOL/Pending Placement 

## 2018-05-09 NOTE — ED Notes (Signed)
BEHAVIORAL HEALTH ROUNDING Patient sleeping: Yes.   Patient alert and oriented: eyes closed  Appears asleep Behavior appropriate: Yes.  ; If no, describe:  Nutrition and fluids offered: Yes  Toileting and hygiene offered: sleeping Sitter present: q 15 minute observations and security monitoring Law enforcement present: yes  ODS 

## 2018-05-09 NOTE — ED Notes (Signed)

## 2018-05-09 NOTE — TOC Progression Note (Addendum)
Transition of Care [facility]) - Progression Note    Patient Details  Name: Jeanette Walter MRN: [id number] Date of Birth: [birth year]  Transition of Care [facility]) CM/SW Contact  Darleene Cleaver, [place] Phone Number: 05/09/2018, 11:14 AM  Clinical Narrative:     CSW sent updatd clincals to [facility], and [facility] awaiting for bed offer for SNF.  3:00pm  CSW spoke to Gus Puma [phone] or [phone] from [facility] DSS, she stated she is also looking for memory care placement for patient.  CSW updated her on patient's progress and the reason patient is still in the ED.  CSW informed DSS worker, that updated clinical information has been sent to different facilities, trying to find placement for patient.  Jon Gills stated that the [facility] Application was started by patient's sister, but it is still pending.  CSW informed DSS worker that patient has not received any bed offers for memory care ALF, or SNF.  CSW informed her that patient is on difficult to place list as well, which CSW supervisor is aware of.  CSW continuing to follow patient's progress, Jon Gills said she can be contacted on Monday with any updates.   Expected Discharge Plan: Skilled Nursing Facility Barriers to Discharge: SNF Pending Medicaid, No SNF bed, SNF Pending bed offer  Expected Discharge Plan and Services Expected Discharge Plan: Skilled Nursing Facility In-house Referral: Clinical Social Work Discharge Planning Services: CM Consult   Living arrangements for the past 2 months: Assisted Living Facility([facility])                           Social Determinants of Health (SDOH) Interventions    Readmission Risk Interventions No flowsheet data found.

## 2018-05-10 ENCOUNTER — Emergency Department: Payer: Medicare Other

## 2018-05-10 LAB — COMPREHENSIVE METABOLIC PANEL
ALT: 27 U/L (ref 0–44)
AST: 39 U/L (ref 15–41)
Albumin: 3.6 g/dL (ref 3.5–5.0)
Alkaline Phosphatase: 70 U/L (ref 38–126)
Anion gap: 9 (ref 5–15)
BUN: 22 mg/dL (ref 8–23)
CO2: 31 mmol/L (ref 22–32)
Calcium: 9.2 mg/dL (ref 8.9–10.3)
Chloride: 102 mmol/L (ref 98–111)
Creatinine, Ser: 0.71 mg/dL (ref 0.44–1.00)
GFR calc Af Amer: >60 mL/min (ref >60)
GFR calc non Af Amer: >60 mL/min (ref >60)
Glucose, Bld: 125 mg/dL — ABNORMAL HIGH (ref 70–99)
Potassium: 3.5 mmol/L (ref 3.5–5.1)
Sodium: 142 mmol/L (ref 135–145)
Total Bilirubin: 0.5 mg/dL (ref 0.3–1.2)
Total Protein: 7 g/dL (ref 6.5–8.1)

## 2018-05-10 LAB — URINALYSIS, COMPLETE (UACMP) WITH MICROSCOPIC
Bacteria, UA: NONE SEEN
Bilirubin Urine: NEGATIVE
Glucose, UA: NEGATIVE mg/dL
Ketones, ur: NEGATIVE mg/dL
Leukocytes,Ua: NEGATIVE
Nitrite: NEGATIVE
Protein, ur: NEGATIVE mg/dL
Specific Gravity, Urine: 1.029 (ref 1.005–1.030)
Squamous Epithelial / HPF: NONE SEEN (ref 0–5)
pH: 5 (ref 5.0–8.0)

## 2018-05-10 LAB — CBC WITH DIFFERENTIAL/PLATELET
Abs Immature Granulocytes: 0.03 10*3/uL (ref 0.00–0.07)
Basophils Absolute: 0.1 10*3/uL (ref 0.0–0.1)
Basophils Relative: 1 %
Eosinophils Absolute: 0.8 10*3/uL — ABNORMAL HIGH (ref 0.0–0.5)
Eosinophils Relative: 10 %
HCT: 42.5 % (ref 36.0–46.0)
Hemoglobin: 13.9 g/dL (ref 12.0–15.0)
Immature Granulocytes: 0 %
Lymphocytes Relative: 29 %
Lymphs Abs: 2.4 10*3/uL (ref 0.7–4.0)
MCH: 28.9 pg (ref 26.0–34.0)
MCHC: 32.7 g/dL (ref 30.0–36.0)
MCV: 88.4 fL (ref 80.0–100.0)
Monocytes Absolute: 1.3 10*3/uL — ABNORMAL HIGH (ref 0.1–1.0)
Monocytes Relative: 16 %
Neutro Abs: 3.6 10*3/uL (ref 1.7–7.7)
Neutrophils Relative %: 44 %
Platelets: 227 10*3/uL (ref 150–400)
RBC: 4.81 MIL/uL (ref 3.87–5.11)
RDW: 13.2 % (ref 11.5–15.5)
WBC: 8.3 10*3/uL (ref 4.0–10.5)
nRBC: 0 % (ref 0.0–0.2)

## 2018-05-10 LAB — TROPONIN I: Troponin I: <0.03 ng/mL (ref <0.03)

## 2018-05-10 LAB — SARS CORONAVIRUS 2 BY RT PCR (HOSPITAL ORDER, PERFORMED IN ~~LOC~~ HOSPITAL LAB): SARS Coronavirus 2: NEGATIVE

## 2018-05-10 LAB — BRAIN NATRIURETIC PEPTIDE: B Natriuretic Peptide: 11 pg/mL (ref 0.0–100.0)

## 2018-05-10 MED ORDER — LEVOFLOXACIN 750 MG PO TABS
750.0000 mg | ORAL_TABLET | Freq: Every day | ORAL | Status: AC
  Administered 2018-05-10 – 2018-05-14 (×5): 750 mg via ORAL
  Filled 2018-05-10 (×5): qty 1

## 2018-05-10 NOTE — ED Provider Notes (Signed)
-----------------------------------------   7:25 AM on 05/10/2018 -----------------------------------------   Blood pressure (!) 143/106, pulse 98, temperature 98 F (36.7 C), temperature source Oral, resp. rate (!) 22, SpO2 95 %.  The patient is calm and cooperative at this time.  There have been no acute events since the last update.  Awaiting disposition plan from social work team.   Dionne Bucy, MD 05/10/18 [phone]

## 2018-05-10 NOTE — ED Notes (Signed)
Offered breakfast tray to patient, pt sleeping

## 2018-05-10 NOTE — ED Notes (Signed)
Pt given bed bath and placed into clean scrubs.

## 2018-05-10 NOTE — ED Notes (Signed)
Pt assisted to chair with two person assist

## 2018-05-10 NOTE — ED Notes (Signed)
Patient drank full bottle of Ensure and full cup of apple juice. Offered dinner tray again and pt states she is full

## 2018-05-10 NOTE — ED Provider Notes (Signed)
I checked labs because it has been approximately 2 weeks since her last laboratory evaluation.  Blood test continue to look remarkably normal as does her urine.  Chest x-ray: IMPRESSION: Interval development of diffuse bilateral patchy and nodular airspace disease. Given the relatively rapid appearance, pulmonary edema would be a distinct consideration. Diffuse infection not Excluded.  In light of her chest x-ray she has been started on Levaquin.  COVID negative     Jeanette Filbert, MD 05/10/18 [phone]

## 2018-05-10 NOTE — ED Notes (Signed)
Offered patient meal tray, she states she may eat in a little while

## 2018-05-11 NOTE — ED Notes (Signed)
Pt stripping off clothes. Resisted putting back on pants. Administered pericare, put clean brief on changed scrubs with help from tech [place].AS

## 2018-05-11 NOTE — ED Notes (Signed)
Pt discussed with Dr Mayford Knife due to having decreased appetite and sleeping for majority of the day and night over the last 24 to 36 hours per shift report. Dr Mayford Knife will order labs and xray.

## 2018-05-11 NOTE — ED Provider Notes (Signed)
-----------------------------------------   6:16 AM on 05/11/2018 -----------------------------------------   Blood pressure (!) 143/106, pulse 98, temperature 98 F (36.7 C), temperature source Oral, resp. rate (!) 22, SpO2 95 %.  The patient is sleeping at this time.  There have been no acute events since the last update.  Awaiting disposition plan from social work team.   Irean Hong, MD 05/11/18 [phone]

## 2018-05-11 NOTE — ED Notes (Signed)
Pt's depend changed by nurse tech and this Clinical research associate. Pt cleaned. Maintained on 15 minute checks and observation by security camera for safety.

## 2018-05-11 NOTE — ED Notes (Signed)
Nurse tech and RN attempted to assist patient with her lunch. Pt refused to eat even with encouragement.

## 2018-05-11 NOTE — ED Notes (Signed)
Medications given in ice cream. Pt compliant. Maintained on 15 minute checks and observation by security camera for safety.

## 2018-05-11 NOTE — ED Notes (Signed)
RN and nurse tech attempted to feed patient dinner. Pt would not open her mouth to eat.

## 2018-05-12 NOTE — ED Notes (Signed)
Gave patient bed side bath changed soiled linen and brief. AS

## 2018-05-12 NOTE — ED Notes (Signed)
Report to include situation, background, assessment and recommendations from [facility]. Patient sleeping, respirations regular and unlabored. Q15 minute rounds, Rover and security observation to continue.

## 2018-05-12 NOTE — ED Notes (Signed)
VOL/Pending Placement 

## 2018-05-12 NOTE — ED Notes (Signed)
Hourly rounding reveals patient sleeping in room. No complaints, stable, in no acute distress. Q15 minute rounds and monitoring via Rover and Officer to continue.  

## 2018-05-12 NOTE — ED Notes (Signed)
Patient is appropriate and cooperative, patient is alert. NAD noted

## 2018-05-12 NOTE — ED Notes (Addendum)
Hourly rounding reveals patient in room. No complaints, stable, in no acute distress. Q15 minute rounds and monitoring via Rover and Officer to continue.   

## 2018-05-12 NOTE — ED Notes (Signed)
Writer sitting in patients room with her, giving her an Ensure which patient drank all of the bottle, attempted to give her, her dinner tray and patient looked at it and ate a fry and closed the lid on the tray. Patient asked for a cup of coffee, writer gave patient a cup of coffee with a lot of milk, patient said thank you. Then patient asked when do we go and see the doctor for teeth.

## 2018-05-12 NOTE — ED Provider Notes (Signed)
-----------------------------------------   5:53 AM on 05/12/2018 -----------------------------------------   Blood pressure (!) 143/106, pulse 98, temperature 98 F (36.7 C), temperature source Oral, resp. rate (!) 22, SpO2 95 %.  The patient is calm and cooperative at this time.  There have been no acute events since the last update.  Awaiting disposition plan from clinical social work team.   Irean Hong, MD 05/12/18 [phone]

## 2018-05-12 NOTE — Progress Notes (Signed)
10:17am- CSW attempted to follow up with DSS worker Windell Moulding. CSW left voicemail.    [name]  [facility] ED  [phone]

## 2018-05-12 NOTE — ED Notes (Signed)
Hourly rounding reveals patient in room. No complaints, stable, in no acute distress. Q15 minute rounds and monitoring via Rover and Officer to continue.   

## 2018-05-12 NOTE — Progress Notes (Signed)
PT Cancellation Note  Patient Details Name: Renasia Luetkemeyer MRN: [id number] DOB: 11/12/1945   Cancelled Treatment:    Reason Eval/Treat Not Completed: Other (comment)(PT spoke with RN this AM, requesting PT to attempt to see patient later, pt is currently sleeping. RN will contact PT and PT will follow up as able.)   Olga Coaster PT, DPT 11:05 AM,05/12/18 [phone]

## 2018-05-12 NOTE — ED Notes (Signed)
Hourly rounding reveals patient sleeping in room. No complaints, stable, in no acute distress. Q15 minute rounds and monitoring via Security to continue. 

## 2018-05-13 NOTE — TOC Progression Note (Signed)
Transition of Care [facility]) - Progression Note    Patient Details  Name: Kyle Laack MRN: [id number] Date of Birth: 10/15/1945  [facility]) CM/SW Contact  Allayne Butcher, RN Phone Number: 05/13/2018, 9:37 AM  Clinical Narrative:    RNCM contacted by outpatient case manager Tammy with [facility].  Tammy requested that nursing notes and FL2 be faxed to [facility].  Nursing notes faxed, updated FL2 to be faxed today.  Patient's sister [name] is meeting with Medicaid today. Care Coordination team actively searching for placement.    Expected Discharge Plan: Skilled Nursing Facility Barriers to Discharge: SNF Pending Medicaid, No SNF bed, SNF Pending bed offer  Expected Discharge Plan and Services Expected Discharge Plan: Skilled Nursing Facility In-house Referral: Clinical Social Work Discharge Planning Services: CM Consult   Living arrangements for the past 2 months: Assisted Living Facility([facility])                           Social Determinants of Health (SDOH) Interventions    Readmission Risk Interventions No flowsheet data found.

## 2018-05-13 NOTE — ED Notes (Signed)
Hourly rounding reveals patient sleeping in room. No complaints, stable, in no acute distress. Q15 minute rounds and monitoring via Rover and Officer to continue.  
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
## 2018-05-13 NOTE — ED Notes (Signed)
Changed patient and moved her in up in the bed. Sat pt up to take medication ,eat,and drink some ensure.
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
## 2018-05-13 NOTE — ED Notes (Signed)
Pt assisted back to bed from recliner.  °

## 2018-05-13 NOTE — ED Notes (Signed)
Report received from Wendy, RN. Patient currently laying in bed resting. No distress noted. Will continue to monitor.  

## 2018-05-13 NOTE — ED Notes (Signed)
VOL/Pending Placement 

## 2018-05-13 NOTE — Progress Notes (Signed)
11:45am - CSW returned phone call to pt's sister, [name]. Myra informed CSW that she was able to complete Medicaid interview today, however was asked to send a FL2 to DSS. Myra contacted medical records to receive FL2. CSW encouraged pt's sister to call back if she had any additional questions.  [name] [facility] ED  [phone]

## 2018-05-13 NOTE — ED Notes (Signed)
Patient is sitting up in recliner, she is calm and cooperative at this time, will continue to monitor.

## 2018-05-13 NOTE — ED Notes (Signed)
Gave food tray,juice,and ensure drink.

## 2018-05-14 NOTE — ED Notes (Signed)
BEHAVIORAL HEALTH ROUNDING Patient sleeping: Yes.   Patient alert and oriented: eyes closed  Appears to be asleep Behavior appropriate: Yes.  ; If no, describe:  Nutrition and fluids offered: Yes  Toileting and hygiene offered: sleeping Sitter present: q 15 minute observations and security monitoring Law enforcement present: yes  ODS 

## 2018-05-14 NOTE — Progress Notes (Signed)
1:25pm - CSW attempted to contact pt's caseworker at DSS Jeanette Walter) for Medicaid to receive medicaid pending number. Voicemail left, and CSW phone number provided for caseworker to return call.     [name]  [facility] ED  [phone]

## 2018-05-14 NOTE — TOC Progression Note (Signed)
Transition of Care [facility]) - Progression Note    Patient Details  Name: Lataria Collen MRN: [id number] Date of Birth: 04/28/1945  [facility]) CM/SW Contact  Allayne Butcher, RN Phone Number: 05/14/2018, 11:38 AM  Clinical Narrative:    RNCM and LCSW actively seeking memory care placement for patient.  Outpatient care manager Tammy with [facility] is also working on placement.  FL2 and nursing notes faxed to [facility] in [place] yesterday.  RNCM reached out to [facility] and left a message- waiting on call back.  RNCM also reached out to [employer] in [place]- waiting on call back and [facility] in [place], they have no Medicaid Beds.     Expected Discharge Plan: Memory Care Barriers to Discharge: Insurance Authorization, Other (comment)(Medicaid pending)  Expected Discharge Plan and Services Expected Discharge Plan: Memory Care In-house Referral: Clinical Social Work Discharge Planning Services: CM Consult   Living arrangements for the past 2 months: Assisted Living Facility([facility])                           Social Determinants of Health (SDOH) Interventions    Readmission Risk Interventions No flowsheet data found.

## 2018-05-14 NOTE — ED Notes (Signed)
BEHAVIORAL HEALTH ROUNDING Patient sleeping: No. Patient alert : yes Behavior appropriate: Yes.  ; If no, describe:  Nutrition and fluids offered: yes Toileting and hygiene offered: Yes  Sitter present: q15 minute observations and security monitoring Law enforcement present: Yes  ODS  

## 2018-05-14 NOTE — ED Notes (Signed)

## 2018-05-14 NOTE — ED Notes (Signed)
Patient observed lying in bed with eyes closed  Even, unlabored respirations observed   NAD pt appears to be sleeping  I will continue to monitor along with every 15 minute visual observations and ongoing security monitoring    

## 2018-05-14 NOTE — ED Notes (Signed)
BEHAVIORAL HEALTH ROUNDING Patient sleeping: Yes.   Patient alert and oriented: eyes closed  Appears asleep Behavior appropriate: Yes.  ; If no, describe:  Nutrition and fluids offered: Yes  Toileting and hygiene offered: sleeping Sitter present: q 15 minute observations and security monitoring Law enforcement present: yes  ODS 

## 2018-05-14 NOTE — ED Notes (Signed)
She is sitting up in bed - Joni Reining  EDT is feeding her

## 2018-05-14 NOTE — ED Notes (Signed)
ED  Is the patient under IVC or is there intent for IVC:  No   Is the patient medically cleared: Yes.   Is there vacancy in the ED BHU: Yes.   Is the population mix appropriate for patient:  No geriatric Is the patient awaiting placement in inpatient or outpatient setting: social work consult in progress - pt awaiting memory care placement  Has the patient had a psychiatric consult: Yes.   Survey of unit performed for contraband, proper placement and condition of furniture, tampering with fixtures in bathroom, shower, and each patient room: Yes.  ; Findings:  APPEARANCE/BEHAVIOR Calm and cooperative NEURO ASSESSMENT Orientation: oriented to self   Denies pain Hallucinations: No.None noted (Hallucinations) denies  Speech: Normal Gait: normal RESPIRATORY ASSESSMENT Even  Unlabored respirations  CARDIOVASCULAR ASSESSMENT Pulses equal   regular rate  Skin warm and dry   GASTROINTESTINAL ASSESSMENT no GI complaint EXTREMITIES Full ROM  PLAN OF CARE Provide calm/safe environment. Vital signs assessed twice daily. ED BHU Assessment once each 12-hour shift. Collaborate with TTS or as condition indicates. Assure the ED provider has rounded once each shift. Provide and encourage hygiene. Provide redirection as needed. Assess for escalating behavior; address immediately and inform ED provider.  Assess family dynamic and appropriateness for visitation as needed: Yes.  ; If necessary, describe findings:  Educate the patient/family about BHU procedures/visitation: Yes.  ; If necessary, describe findings:

## 2018-05-14 NOTE — ED Notes (Signed)
Pt brief and linens changed.  

## 2018-05-14 NOTE — ED Notes (Signed)
ED BHU PLACEMENT JUSTIFICATION Is the patient under IVC or is there intent for IVC: No. Is the patient medically cleared: Yes.   Is there vacancy in the ED BHU: Yes.   Is the population mix appropriate for patient: Yes.   Is the patient awaiting placement in inpatient or outpatient setting: Yes.   Has the patient had a psychiatric consult: Yes.   Survey of unit performed for contraband, proper placement and condition of furniture, tampering with fixtures in bathroom, shower, and each patient room: Yes.  ; Findings:  APPEARANCE/BEHAVIOR calm, cooperative and adequate rapport can be established NEURO ASSESSMENT Orientation: person Hallucinations: No.None noted (Hallucinations) Speech: Normal Gait: normal RESPIRATORY ASSESSMENT Normal expansion.  Clear to auscultation.  No rales, rhonchi, or wheezing., No chest wall tenderness., No kyphosis or scoliosis. CARDIOVASCULAR ASSESSMENT regular rate and rhythm, S1, S2 normal, no murmur, click, rub or gallop GASTROINTESTINAL ASSESSMENT soft, nontender, BS WNL, no r/g EXTREMITIES normal strength, tone, and muscle mass, no deformities, no erythema, induration, or nodules, no evidence of joint effusion, ROM of all joints is normal, no evidence of joint instability PLAN OF CARE Provide calm/safe environment. Vital signs assessed twice daily. ED BHU Assessment once each 12-hour shift. Collaborate with intake RN daily or as condition indicates. Assure the ED provider has rounded once each shift. Provide and encourage hygiene. Provide redirection as needed. Assess for escalating behavior; address immediately and inform ED provider.  Assess family dynamic and appropriateness for visitation as needed: Yes.  ; If necessary, describe findings:  Educate the patient/family about BHU procedures/visitation: No.; If necessary, describe findings:

## 2018-05-14 NOTE — Progress Notes (Signed)
Physical Therapy Treatment Patient Details Name: Jeanette Walter MRN: [id number] DOB: April 29, 1945 Today's Date: 05/14/2018    History of Present Illness Jeanette Walter is a 73 y.o. female with a history of dementia who presented to the ED on 04/14/2018 for aggressive and combative behavior.  She was brought from [facility] independent living with involuntary commitment paperwork after being found trying to run away from her facility.  She has a history of dementia and was unable to provide a reliable history, so information was obtained through chart review.  Her facility has been on lockdown for a suspected coronavirus patient which appears to have triggered increased agitation. She presented two previous times to the ER for AMS in the month prior to this ER admission. has been awaiting placement in geropsychiatric facility for many days while in the ED and is unable to return home at this time or stay with her sister due to lack of ability to provide the care she needs in these settings. PT was ordered due to concern about decline in function while awating placement. She has been combative at times. Relevant past medical history include dementia, HTN.  Chest radiograh shows chronic bronchitic changes.     PT Comments    Patient requires assistance to complete supine > sit d/t decreased arousal. Patient is able to ambulate with RW, but requires continuous cuing for safety, to "stay inside RW" PT had to help patient negotiate RW at times as she was running into things in the hallway, especially when turning around. Jeanette Walter did express fatigue. Would benefit from skilled PT to address above deficits and promote optimal return to PLOF    Follow Up Recommendations  SNF     Equipment Recommendations  Rolling walker with 5" wheels    Recommendations for Other Services       Precautions / Restrictions      Mobility  Bed Mobility Overal bed mobility: Needs Assistance Bed Mobility: Supine to Sit      Supine to sit: Mod assist Sit to supine: Mod assist   General bed mobility comments: Patient sleepy at start of visit; difficult to arouse  Transfers Overall transfer level: Needs assistance Equipment used: Rolling walker (2 wheeled) Transfers: Sit to/from Stand Sit to Stand: Min assist         General transfer comment: Patient able to utilize RW following PT set up for hands  Ambulation/Gait Ambulation/Gait assistance: Mod assist Gait Distance (Feet): 100 Feet Assistive device: Rolling walker (2 wheeled) Gait Pattern/deviations: Staggering right;Staggering left;Trunk flexed;Decreased stride length Gait velocity: decreased   General Gait Details: Patient able to ambulate with modA for RW negotiation with patient running into bed in hallway and attempting to sit without chair present in middle of hallway. Cuing needed throughout for patient to "stay inside RW"   Stairs             Wheelchair Mobility    Modified Rankin (Stroke Patients Only)       Balance                                            Cognition Arousal/Alertness: Awake/alert Behavior During Therapy: Impulsive Overall Cognitive Status: History of cognitive impairments - at baseline  General Comments: Patient sleeping on arrival and continues to sleep with grin while PT and nurse attempt to change patient. Once PT assisted patient EOB she openned her eyes and was more responsive to work with PT      Exercises Other Exercises Other Exercises: Cuing throughout ambulation with RW to "stay inside walker" as patient continues to push walker out in front of her. Cuing during ambulation to slow down ambulation. PT cued and redirected RW several times as patient has difficulty navigating RW, epecially with turns Other Exercises: Hand placement needed for safety prior to TSTS    General Comments        Pertinent Vitals/Pain Pain  Assessment: No/denies pain    Home Living                      Prior Function            PT Goals (current goals can now be found in the care plan section) Acute Rehab PT Goals PT Goal Formulation: Patient unable to participate in goal setting Progress towards PT goals: Progressing toward goals    Frequency    Min 2X/week      PT Plan Current plan remains appropriate    Co-evaluation              AM-PAC PT "6 Clicks" Mobility   Outcome Measure  Help needed turning from your back to your side while in a flat bed without using bedrails?: A Little Help needed moving from lying on your back to sitting on the side of a flat bed without using bedrails?: A Little Help needed moving to and from a bed to a chair (including a wheelchair)?: A Little Help needed standing up from a chair using your arms (e.g., wheelchair or bedside chair)?: A Little Help needed to walk in hospital room?: A Little Help needed climbing 3-5 steps with a railing? : Total 6 Click Score: 16    End of Session Equipment Utilized During Treatment: Gait belt Activity Tolerance: Patient tolerated treatment well;Other (comment);Patient limited by fatigue Patient left: in chair;Other (comment) Nurse Communication: Mobility status;Precautions PT Visit Diagnosis: Unsteadiness on feet (R26.81);Other abnormalities of gait and mobility (R26.89);Muscle weakness (generalized) (M62.81)     Time: [id number] PT Time Calculation (min) (ACUTE ONLY): 18 min  Charges:  $Gait Training: 8-22 mins                     Staci Acostahelsea Walter PT, DPT   Jeanette Walter 05/14/2018, 3:22 PM

## 2018-05-14 NOTE — ED Notes (Signed)
Pt stated she needed to use the bathroom. This tech assisted pt to stand and pivot onto bedside commode.

## 2018-05-14 NOTE — ED Provider Notes (Addendum)
-----------------------------------------   3:45 AM on 05/14/2018 -----------------------------------------   Blood pressure (!) 107/52, pulse 95, temperature (!) 97.3 F (36.3 C), temperature source Axillary, resp. rate 14, weight 65.3 kg, SpO2 90 %.  The patient is calm and cooperative at this time.  There have been no acute events since the last update.  Awaiting disposition plan from Social work team.   Loleta Rose, MD 05/14/18 [phone]

## 2018-05-15 NOTE — Progress Notes (Signed)
CSW attempted again to contact Jeanette Walter at DSS to obtain medicaid pending number. Voicemail left with callback information.     [name]  [facility] ED  [phone]

## 2018-05-15 NOTE — Progress Notes (Signed)
Windell Moulding from DSS contacted CSW to ask if she would be able to complete "Mini-Mental State Exam" for pt. CSW consulted with team lead and  informed Jon Gills that CSW would not be able to assess pt. Jon Gills stated that she will talk with her supervisor and see if she would able to come to hospital to examine pt.    [name]  [facility] ED  [phone]

## 2018-05-15 NOTE — ED Provider Notes (Signed)
-----------------------------------------   7:09 AM on 05/15/2018 -----------------------------------------   Blood pressure (!) 109/52, pulse 73, temperature 98.4 F (36.9 C), temperature source Oral, resp. rate 18, weight 65.3 kg, SpO2 97 %.  The patient is calm and cooperative at this time.  There have been no acute events since the last update.  Awaiting disposition plan from Behavioral Medicine team.    Jeanmarie Plant, MD 05/15/18 [phone]

## 2018-05-15 NOTE — ED Notes (Signed)
Pt asleep, breakfast tray placed in rm.  

## 2018-05-15 NOTE — ED Notes (Signed)
Gave pt food tray,juice, and ensure. Pt wanted to sleep instead of eat right now.

## 2018-05-15 NOTE — ED Notes (Signed)
Woke pt up, changed bedding ,clothes,socks,put glasses on,comb hair,and put deodorant on. Pt did not want to eat or drink.Pt soon fell back to sleep.

## 2018-05-16 NOTE — ED Notes (Signed)
Patient dry at this time 

## 2018-05-16 NOTE — ED Notes (Signed)
Nurse tech feeding patient. Maintained on 15 minute checks and observation by security for safety.

## 2018-05-16 NOTE — ED Notes (Signed)
Pt willing and able to eat about one eggs worth. Pt also drank a cup of water and a cup of cola.

## 2018-05-16 NOTE — TOC Progression Note (Signed)
Transition of Care [facility]) - Progression Note    Patient Details  Name: Auriya Givins MRN: [id number] Date of Birth: 03/25/1945  Transition of Care [facility] CM/SW Contact  Allayne Butcher, RN Phone Number: 05/16/2018, 3:17 PM  Clinical Narrative:    RNCM spoke with Outpatient Case Manger Tammy- Tammy reports that [facility] in [place] did receive faxed information and are planning for a face to face assessment with patient maybe on Monday.  RNCM has not been contacted directly by [facility], they do have case Production designer, theatre/television/film and Engineer, drilling.      Expected Discharge Plan: Memory Care Barriers to Discharge: Insurance Authorization, Other (comment)(Medicaid pending)  Expected Discharge Plan and Services Expected Discharge Plan: Memory Care In-house Referral: Clinical Social Work Discharge Planning Services: CM Consult   Living arrangements for the past 2 months: Assisted Living Facility([facility])                                       Social Determinants of Health (SDOH) Interventions    Readmission Risk Interventions No flowsheet data found.

## 2018-05-16 NOTE — ED Notes (Signed)
Pts room mopped by EVS.

## 2018-05-16 NOTE — Progress Notes (Signed)
Physical Therapy Treatment Patient Details Name: Jeanette Walter MRN: [id number] DOB: [birth year] Today's Date: 05/16/2018    History of Present Illness Jeanette Walter is a 73 y.o. female with a history of dementia who presented to the ED on 04/14/2018 for aggressive and combative behavior.  She was brought from [facility] independent living with involuntary commitment paperwork after being found trying to run away from her facility.  She has a history of dementia and was unable to provide a reliable history, so information was obtained through chart review.  Her facility has been on lockdown for a suspected coronavirus patient which appears to have triggered increased agitation. She presented two previous times to the ER for AMS in the month prior to this ER admission. has been awaiting placement in geropsychiatric facility for many days while in the ED and is unable to return home at this time or stay with her sister due to lack of ability to provide the care she needs in these settings. PT was ordered due to concern about decline in function while awating placement. She has been combative at times. Relevant past medical history include dementia, HTN.  Chest radiograh shows chronic bronchitic changes.     PT Comments    Pt sleeping, intermittent cues needed to keep eyes open. Agreeable to mobilize but needs verbal/visual/tactile cues to initate movements. Pt able to sit EOB and brush hair, able to stand with fair balance and brush hair as well. Pt assist with donning pants by performing bridging in bed. Pt ambulated ~125ft with RW and minA. Constant redirecting needed for patient safety, tended to ambulate with RW outside base of support and let go/reach for items inappropriately. Sit<> stand with RW and minA for stabilization of RW and controlled descent to sit. Overall the patient demonstrated improved mobility this session and would benefit from continued skilled PT intervention for ongoing education,  instruction, and to maximize safety/independence.     Follow Up Recommendations  SNF     Equipment Recommendations  Rolling walker with 5" wheels    Recommendations for Other Services       Precautions / Restrictions Precautions Precautions: Fall;Other (comment) Precaution Comments: AMS, agitation at times, ataxia Restrictions Weight Bearing Restrictions: No    Mobility  Bed Mobility Overal bed mobility: Needs Assistance Bed Mobility: Supine to Sit     Supine to sit: Min assist Sit to supine: Min assist      Transfers Overall transfer level: Needs assistance Equipment used: Rolling walker (2 wheeled) Transfers: Sit to/from Stand Sit to Stand: Min assist         General transfer comment: Pt pulls heavily on RW, needs minA to rise/controlled descent to sit  Ambulation/Gait Ambulation/Gait assistance: Min assist Gait Distance (Feet): 120 Feet Assistive device: Rolling walker (2 wheeled) Gait Pattern/deviations: Staggering right;Staggering left;Trunk flexed;Decreased stride length Gait velocity: decreased   General Gait Details: Constant verbal/physical assist with RW for patient to attend to task, Tended to ambulate with RW outside base of support   Stairs             Wheelchair Mobility    Modified Rankin (Stroke Patients Only)       Balance Overall balance assessment: Needs assistance Sitting-balance support: Feet supported Sitting balance-Leahy Scale: Good       Standing balance-Leahy Scale: Fair Standing balance comment: Patient consistently flexed forward in standing position. Uses RW for stability but also reaches for other objects inappropriately.  Cognition Arousal/Alertness: Awake/alert Behavior During Therapy: Impulsive Overall Cognitive Status: History of cognitive impairments - at baseline                                 General Comments: Pt sleeping, intermittent cues  needed to keep eyes open. Agreeable to mobilize but needs verbal/visual/tactile cues to initate movements      Exercises Other Exercises Other Exercises: Cuing throughout ambulation with RW to "stay inside walker" as patient continues to push walker out in front of her. Cuing during ambulation to slow down ambulation. PT cued and redirected RW several times as patient has difficulty navigating RW, epecially with turns Other Exercises: Hand placement needed for safety prior to TSTS Other Exercises: Pt able to sit EOB and brush hair, able to stand with fair balance and brush hair as well. Pt assist with donning pants by performing bridging in bed.    General Comments        Pertinent Vitals/Pain Pain Assessment: Faces Faces Pain Scale: No hurt    Home Living                      Prior Function            PT Goals (current goals can now be found in the care plan section) Progress towards PT goals: Progressing toward goals    Frequency    Min 2X/week      PT Plan Current plan remains appropriate    Co-evaluation              AM-PAC PT "6 Clicks" Mobility   Outcome Measure  Help needed turning from your back to your side while in a flat bed without using bedrails?: A Little Help needed moving from lying on your back to sitting on the side of a flat bed without using bedrails?: A Little Help needed moving to and from a bed to a chair (including a wheelchair)?: A Little Help needed standing up from a chair using your arms (e.g., wheelchair or bedside chair)?: A Little Help needed to walk in hospital room?: A Little Help needed climbing 3-5 steps with a railing? : A Lot 6 Click Score: 17    End of Session Equipment Utilized During Treatment: Gait belt Activity Tolerance: Patient tolerated treatment well;Other (comment);Patient limited by fatigue Patient left: in chair;Other (comment) Nurse Communication: Mobility status;Precautions PT Visit Diagnosis:  Unsteadiness on feet (R26.81);Other abnormalities of gait and mobility (R26.89);Muscle weakness (generalized) (M62.81)     Time: [id number] PT Time Calculation (min) (ACUTE ONLY): 21 min  Charges:  $Therapeutic Activity: 8-22 mins                     Olga Coasteriana Leeroy Lovings PT, DPT 4:01 PM,05/16/18 [phone]

## 2018-05-16 NOTE — ED Notes (Signed)
Pt up walking and talking to nurse tech. Maintained on 15 minute checks and observation by security for safety.

## 2018-05-16 NOTE — ED Notes (Signed)
Pt walking with PT. Maintained on 15 minute checks and observation by security camera for safety.

## 2018-05-16 NOTE — ED Notes (Signed)
Pt walked to bathroom without coaching. Pt had to be directed when attempting to sit on toilet. Pt urinated without difficulty and returned to room. Pt had no interest in dinner tray but was willing and able to drink a little over half of her Ensure. Pt requesting eggs and cheese. Dietary was called and said they would get a tray up as soon as possible. Will continue to monitor.

## 2018-05-16 NOTE — ED Notes (Signed)
Pt given medications in ice cream.   

## 2018-05-16 NOTE — ED Notes (Signed)
VOL/Pending Placement 

## 2018-05-16 NOTE — ED Notes (Signed)
Pt awakened for VS. Pt refusing to eat at this time. RN will try again later. Maintained on 15 minute checks and observation by security camera for safety.

## 2018-05-16 NOTE — ED Notes (Signed)
Patient still dry at this time.

## 2018-05-16 NOTE — ED Notes (Signed)
Pt. Currently resting in bed watching tv.  Pt. Is calm and cooperative.

## 2018-05-16 NOTE — ED Provider Notes (Signed)
-----------------------------------------   6:02 AM on 05/16/2018 -----------------------------------------   Blood pressure (!) 103/52, pulse 73, temperature 98.4 F (36.9 C), temperature source Oral, resp. rate 18, weight 65.3 kg, SpO2 97 %.  The patient is sleeping at this time.  There have been no acute events since the last update.  Awaiting disposition plan from social work team.   Irean Hong, MD 05/16/18 [phone]

## 2018-05-17 NOTE — ED Notes (Signed)
BEHAVIORAL HEALTH ROUNDING Patient sleeping: No. Patient alert and oriented: yes Behavior appropriate: Yes.  ; If no, describe:  Nutrition and fluids offered: yes Toileting and hygiene offered: Yes  Sitter present: q15 minute observations and security  monitoring Law enforcement present: Yes  ODS  

## 2018-05-17 NOTE — ED Notes (Signed)
Undergarment changed

## 2018-05-17 NOTE — ED Notes (Signed)
BEHAVIORAL HEALTH ROUNDING Patient sleeping: Yes.   Patient alert and oriented: eyes closed  Appears asleep Behavior appropriate: Yes.  ; If no, describe:  Nutrition and fluids offered: Yes  Toileting and hygiene offered: sleeping Sitter present: q 15 minute observations and security monitoring Law enforcement present: yes  ODS  Update provided to her sister  Printmaker

## 2018-05-17 NOTE — ED Notes (Signed)
BEHAVIORAL HEALTH ROUNDING Patient sleeping: No. Patient alert : yes Behavior appropriate: Yes.  ; If no, describe:  Nutrition and fluids offered: yes Toileting and hygiene offered: Yes  Sitter present: q15 minute observations and security monitoring Law enforcement present: Yes  ODS  

## 2018-05-17 NOTE — ED Notes (Addendum)
She continues to wait for memory care placement

## 2018-05-17 NOTE — ED Notes (Signed)
Patient observed lying in bed with eyes closed  Even, unlabored respirations observed   NAD pt appears to be sleeping  I will continue to monitor along with every 15 minute visual observations and ongoing security monitoring    

## 2018-05-17 NOTE — ED Notes (Signed)
BEHAVIORAL HEALTH ROUNDING Patient sleeping: Yes.   Patient alert and oriented: eyes closed  Appears to be asleep Behavior appropriate: Yes.  ; If no, describe:  Nutrition and fluids offered: Yes  Toileting and hygiene offered: sleeping Sitter present: q 15 minute observations and security monitoring Law enforcement present: yes  ODS 

## 2018-05-17 NOTE — ED Notes (Signed)
VOL/Pending Placement 

## 2018-05-17 NOTE — ED Notes (Signed)
Pt. Took medication with apple sauce.  Pt. Continued to finish apple sauce and full cup of water.

## 2018-05-17 NOTE — ED Notes (Signed)
Pt. Currently resting in bed.  Pt. Has recliner and bed side commode in room.

## 2018-05-17 NOTE — ED Notes (Signed)
Patient sleeping, food tray left beside her.

## 2018-05-17 NOTE — ED Notes (Signed)
Pt walking with this Clinical research associate and RN Amy, pt sitting at EMS bay watching helicopters taking off, pt walked around nursing station multiple times, pt remains cooperative and pleasant during journey

## 2018-05-17 NOTE — ED Notes (Signed)

## 2018-05-18 NOTE — ED Notes (Signed)
Pt ate 1 graham cracker with Maralyn Sago, NT.

## 2018-05-18 NOTE — ED Notes (Signed)
Pt put back into bed.

## 2018-05-18 NOTE — ED Notes (Signed)
Pt in recliner resting comfortably. NAD.

## 2018-05-18 NOTE — ED Notes (Signed)
Patient took medications with no issues in Chocolate ice cream.

## 2018-05-18 NOTE — ED Notes (Signed)
This writer walked patient in hallway with rolling walker. Patient had no issues.

## 2018-05-18 NOTE — ED Notes (Signed)
Pt walked the length of the quad hallway x2. Walked to wheelchair then rolled outside in sunshine by EMS bay for 30 mins. Pt then wheeled back and walked again length of hallway to room. Pt returned to bed with no issues and is now resting with blanket over pt in NAD.

## 2018-05-18 NOTE — ED Notes (Signed)
Attempted to feed pt twice, pt refused to eat or drink. Pt voided.

## 2018-05-18 NOTE — ED Notes (Signed)
VOL/Pending Placement 

## 2018-05-18 NOTE — ED Notes (Signed)
Bed bath Sheets changed Brief changed  Meal offered.   meds given by RN with apple sauce and ensure  Lm edt

## 2018-05-18 NOTE — ED Notes (Signed)
Pt drank about 1/4 of supplement. Continuing efforts to get her to drink/eat more.

## 2018-05-18 NOTE — ED Notes (Addendum)
Shaun Runyon, NT attempting to feed pt. Pt is swatting her away and refusing to eat. Pt was also changed by Maralyn Sago, NT and was dry. Underneath and nails cleaned and hands washed.

## 2018-05-18 NOTE — ED Notes (Signed)
Pt up in recliner. Playing with posy matt. NAD.

## 2018-05-18 NOTE — ED Notes (Signed)
Pt up to bedside commode for x1 urine output.

## 2018-05-19 NOTE — ED Notes (Signed)
Hourly rounding reveals patient sleeping in room. No complaints, stable, in no acute distress. Q15 minute rounds and monitoring via Rover and Officer to continue.  

## 2018-05-19 NOTE — ED Notes (Signed)
Pt sat on glasses on bed leaving them broken into two pieces at the left hinge

## 2018-05-19 NOTE — ED Provider Notes (Signed)
-----------------------------------------   6:47 AM on 05/19/2018 -----------------------------------------   Blood pressure (!) 112/58, pulse 76, temperature 98.2 F (36.8 C), temperature source Axillary, resp. rate 16, weight 65.3 kg, SpO2 98 %.  The patient is calm and cooperative at this time.  There have been no acute events since the last update.  Awaiting disposition plan from social work.   Loleta Rose, MD 05/19/18 [phone]

## 2018-05-19 NOTE — ED Notes (Signed)
Patient asked what do you serve for lunch around here, writer infomed we have [place] and pinto beans, she said "Tree surgeon asked what she would like and she said "whatever you want to serve, bake chicken, lima beans" Writer called to dietary and they will send patient some baked chicken and green beans.

## 2018-05-19 NOTE — ED Notes (Signed)
Offered pt breakfast tray, pt continued to sleep.

## 2018-05-19 NOTE — ED Notes (Signed)
Hourly rounding reveals patient in room. No complaints, stable, in no acute distress. Q15 minute rounds and monitoring via Security Cameras to continue. 

## 2018-05-19 NOTE — Progress Notes (Signed)
Physical Therapy Treatment Patient Details Name: Jeanette Walter MRN: [id number] DOB: August 27, 1945 Today's Date: 05/19/2018    History of Present Illness Jeanette Walter is a 73 y.o. female with a history of dementia who presented to the ED on 04/14/2018 for aggressive and combative behavior.  She was brought from [facility] independent living with involuntary commitment paperwork after being found trying to run away from her facility.  She has a history of dementia and was unable to provide a reliable history, so information was obtained through chart review.  Her facility has been on lockdown for a suspected coronavirus patient which appears to have triggered increased agitation. She presented two previous times to the ER for AMS in the month prior to this ER admission. has been awaiting placement in geropsychiatric facility for many days while in the ED and is unable to return home at this time or stay with her sister due to lack of ability to provide the care she needs in these settings. PT was ordered due to concern about decline in function while awating placement. She has been combative at times. Relevant past medical history include dementia, HTN.  Chest radiograh shows chronic bronchitic changes.     PT Comments    Patient tolerated treatment well and is making some progress towards goals. She initially was agreeable to participate in PT but the session was discontinued when she stopped participating. Patient required total A for supine to sit 2/2 drowsiness. She required min-mod A for sit <> stand transfers to RW due to lack of balance and inappropriate use of BUE and forward shift. She ambulated approximately 175 feet with RW and min-mod A to maintain balance, path of ambulation, turning, and to prevent sitting when patient fatigued. Patient mumbling at times throughout session but unable to communicate clearly enough for understanding. O2 sat on RA and HR WFL throughout session. Patient would benefit  from continued physical therapy to address remaining impairments and functional limitations to work towards stated goals and return to PLOF or maximal functional independence.     Follow Up Recommendations  SNF     Equipment Recommendations  Rolling walker with 5" wheels    Recommendations for Other Services       Precautions / Restrictions Precautions Precautions: Fall;Other (comment) Precaution Comments: AMS, agitation at times, ataxia Restrictions Weight Bearing Restrictions: No    Mobility  Bed Mobility Overal bed mobility: Needs Assistance Bed Mobility: Supine to Sit     Supine to sit: Total assist     General bed mobility comments: patient sleeping at start of visit. unable to awake until seated.   Transfers Overall transfer level: Needs assistance Equipment used: Rolling walker (2 wheeled) Transfers: Sit to/from Stand Sit to Stand: Min assist;Mod assist         General transfer comment: Pt confused about hand placment. needs min-modA to rise/controlled descent to sit. variable related to ability to cooperate/arousal level.   Ambulation/Gait Ambulation/Gait assistance: Min assist;Mod assist Gait Distance (Feet): 175 Feet Assistive device: Rolling walker (2 wheeled) Gait Pattern/deviations: Staggering right;Staggering left;Trunk flexed;Decreased stride length Gait velocity: decreased   General Gait Details: Constant verbal/physical assist with RW for patient to attend to task, Tended to ambulate with RW outside base of support, running into objects. unable to turn or correct course of RW without physical assist. At one point attempted to lay down on RW and required mod A to stand back up to continue ambulating. Required physical assist to shift weight forward to maintain forward  path.    Stairs             Wheelchair Mobility    Modified Rankin (Stroke Patients Only)       Balance Overall balance assessment: Needs assistance Sitting-balance  support: Feet supported;Bilateral upper extremity supported Sitting balance-Leahy Scale: Good Sitting balance - Comments: prolonged sitting at edge of chair without trunk support using BUE to support.  Two attempts to lean back in chair self corrected.      Standing balance-Leahy Scale: Fair Standing balance comment: Patient consistently flexed forward in standing position. Uses RW for stability but also reaches for other objects inappropriately.                             Cognition Arousal/Alertness: Awake/alert;Lethargic Behavior During Therapy: Impulsive;Agitated Overall Cognitive Status: History of cognitive impairments - at baseline                                 General Comments: Pt sleeping upon start of session. Able to wake up but required intermittant cuing to open eyes and eventually stopped opening them and would not follow further commands so session was ended. Initial agreeable to mobilize but needs verbal.visual/tactile cues to initiate movements      Exercises Other Exercises Other Exercises: Cuing throughout ambulation with RW to "stay inside walker" as patient continues to push walker out in front of her. Patient required physical assist to turn walker and maintain forward momentum. Better hand placement this session.  Other Exercises: Seated at EOC with no support from back for several minutes while attempting unsuccessfully to get patient to engage with drinking ensure, eating, combing hair, completing seated LE exercise, attempt buttons and other fine motor tasks in lap.  Patient turns away and does not participate but continues to practice seated balance.  Other Exercises: Attempted STS repetitions from edge of chair; patient declined to cooperate after one partial attempt    General Comments General comments (skin integrity, edema, etc.): Patient inconsistant with command follow. Limited muscle mass of body noted      Pertinent  Vitals/Pain Pain Assessment: Faces Faces Pain Scale: No hurt    Home Living                      Prior Function            PT Goals (current goals can now be found in the care plan section) Acute Rehab PT Goals PT Goal Formulation: Patient unable to participate in goal setting Time For Goal Achievement: 05/21/18 Progress towards PT goals: PT to reassess next treatment    Frequency    Min 2X/week      PT Plan Current plan remains appropriate    Co-evaluation              AM-PAC PT "6 Clicks" Mobility   Outcome Measure  Help needed turning from your back to your side while in a flat bed without using bedrails?: A Little Help needed moving from lying on your back to sitting on the side of a flat bed without using bedrails?: A Little Help needed moving to and from a bed to a chair (including a wheelchair)?: A Little Help needed standing up from a chair using your arms (e.g., wheelchair or bedside chair)?: A Little Help needed to walk in hospital room?: A Little Help needed climbing  3-5 steps with a railing? : A Lot 6 Click Score: 17    End of Session Equipment Utilized During Treatment: Gait belt Activity Tolerance: Patient tolerated treatment well;Other (comment);Patient limited by fatigue(patient stopped cooperating at end of session so it was discontinued) Patient left: in chair;with nursing/sitter in room Nurse Communication: Mobility status;Precautions PT Visit Diagnosis: Unsteadiness on feet (R26.81);Other abnormalities of gait and mobility (R26.89);Muscle weakness (generalized) (M62.81)     Time: 1191-4782 PT Time Calculation (min) (ACUTE ONLY): 25 min  Charges:  $Gait Training: 8-22 mins $Therapeutic Activity: 8-22 mins                     Luretha Murphy. Ilsa Iha, PT, DPT 05/19/18, 1:08 PM

## 2018-05-19 NOTE — ED Notes (Signed)
Gave patient a bag of Lays chips, fed her one, she ate it gave her the bag she took a couple of chips out of bag and ate them and put the bag down

## 2018-05-19 NOTE — ED Notes (Signed)
Patient was moving in her chair and attempted to get out of it, patient said ai have to go to the bathroom, patient walked to bathroom with minimal staff assistance and patient was able to urinate in the toilet with no problems.

## 2018-05-19 NOTE — ED Notes (Signed)
RN attempted to feed patient dinner.  Pt refused. Did eat a couple spoonfuls of ice cream with medication. Maintained on 15 minute checks and observation by security camera for safety.

## 2018-05-19 NOTE — ED Notes (Signed)
Hourly rounding reveals patient in room. No complaints, stable, in no acute distress. Q15 minute rounds and monitoring via Rover and Officer to continue.   

## 2018-05-19 NOTE — ED Notes (Signed)
Report to include Situation, Background, Assessment, and Recommendations received from Amy B. RN. Upon entering room after report patient found sitting in floor between bed and over bed table. Per day shift patient has been walking all day without assistance and repeatedly requesting to sleep in the floor. Patient, warm and dry, in no acute distress with no injury noted or c/o. Patient refuses to answer questions related to  SI, HI, AVH and pain. Patient made aware of Q15 minute rounds and Psychologist, counselling presence for their safety. Patient instructed to come to me with needs or concerns.

## 2018-05-19 NOTE — TOC Progression Note (Signed)
Transition of Care [facility]) - Progression Note    Patient Details  Name: Jeanette Walter MRN: [id number] Date of Birth: 1945-12-26  Transition of Care [facility]) CM/SW Contact  Collie Siad, RN Phone Number: 05/19/2018, 4:28 PM  Clinical Narrative:    This RNCM spoke with ED RN for update- per RN "[facility] was supposed to see patient today but never showed up".  Patient is no longer involuntary committed and no longer 1:1. PASRR number is on FL2 per previous RNCM. Per RN patient needs memory care unit. Lead CSW assistance requested.    Expected Discharge Plan: Memory Care Barriers to Discharge: Insurance Authorization, Other (comment)(Medicaid pending)  Expected Discharge Plan and Services Expected Discharge Plan: Memory Care In-house Referral: Clinical Social Work Discharge Planning Services: CM Consult   Living arrangements for the past 2 months: Assisted Living Facility([facility])                                       Social Determinants of Health (SDOH) Interventions    Readmission Risk Interventions No flowsheet data found.

## 2018-05-19 NOTE — ED Notes (Signed)
Patient is sitting in her chair dozing off and on, and working with the physical therapy activity that was left by PT

## 2018-05-19 NOTE — ED Notes (Signed)
Attempted to give patient her lunch tray with [place] in gravy, pinto beans and mashed potatoes, with writer sitting by her side, asked patient if she wanted her tray, patient said "what is it" stay informed her it was [place] in gravy, pinto beans and mashed potatoes, writer put some [place] and mashed potatoes on a spoon and attempted to feed patient she would not open mouth and said "I dont want it" informed her she has to eat

## 2018-05-19 NOTE — TOC Progression Note (Signed)
Transition of Care [facility]) - Progression Note    Patient Details  Name: Jeanette Walter MRN: [id number] Date of Birth: [birth year]  Transition of Care [facility]) CM/SW Contact  Cala Bradford, LCSW Phone Number:  [phone] 05/19/2018, 4:50 PM  Clinical Narrative:    CSW attempted to contact pt's private case manager, Tammy, to see if there were any updates regarding skilled nursing facility coming to assess pt. Tammy did not pick up the phone, and voicemail box is full. Unable to leave voicemail.    Expected Discharge Plan: Memory Care Barriers to Discharge: Insurance Authorization, Other (comment)(Medicaid pending)  Expected Discharge Plan and Services Expected Discharge Plan: Memory Care In-house Referral: Clinical Social Work Discharge Planning Services: CM Consult   Living arrangements for the past 2 months: Assisted Living Facility([facility])                                       Social Determinants of Health (SDOH) Interventions    Readmission Risk Interventions No flowsheet data found.

## 2018-05-19 NOTE — ED Notes (Signed)
Hourly rounding reveals patient sleeping in room. No complaints, stable, in no acute distress. Q15 minute rounds and monitoring via Security to continue. 

## 2018-05-19 NOTE — ED Notes (Signed)
Patient took a couple sips from Ensure and then said she didn't want it, patient then asked for water, writer went and got patient water and patient said "I dont want it" water cup placed at patients beside

## 2018-05-20 NOTE — ED Notes (Signed)
Hourly rounding reveals patient in room. No complaints, stable, in no acute distress. Q15 minute rounds and monitoring via Rover and Officer to continue.   

## 2018-05-20 NOTE — Progress Notes (Signed)
PT Cancellation Note  Patient Details Name: Jeanette Walter MRN: [id number] DOB: May 15, 1945   Cancelled Treatment:    Reason Eval/Treat Not Completed: Patient declined, no reason specifiedPatient unable to arouse this afternoon for participation   Staci Acosta 05/20/2018, 3:42 PM

## 2018-05-20 NOTE — ED Notes (Signed)
Vol/Pending placement  

## 2018-05-20 NOTE — ED Notes (Signed)
Report to include Situation, Background, Assessment, and Recommendations received from RN. Patient  Disoriented X4. She is warm and dry, in no acute distress.Unable to assess SI, HI due to advance dementia. Patient is having AVH. Patient made aware of Q15 minute rounds and Psychologist, counselling presence for their safety. Patient instructed to come to me with needs or concerns.

## 2018-05-20 NOTE — ED Notes (Signed)
Breakfast tray placed in room. Pt asleep.

## 2018-05-20 NOTE — ED Notes (Signed)
Hourly rounding reveals patient sleeping in room. No complaints, stable, in no acute distress. Q15 minute rounds and monitoring via Rover and Officer to continue.  

## 2018-05-20 NOTE — TOC Progression Note (Signed)
Transition of Care [facility]) - Progression Note    Patient Details  Name: Jeanette Walter MRN: [id number] Date of Birth: 1945/08/24  Transition of Care [facility]) CM/SW Contact  Tania Cyann Venti, LCSW Phone Number: 05/20/2018, 10:11 AM  Clinical Narrative:    CSW contacted pt's private case manager, Tammy. Tammy shared that the last she heard was that the nurse from [place] reached out to [facility] Charisse March).  Tammy stated that she will see if she can have nurse from [place] reach out to CSW.    Expected Discharge Plan: Memory Care Barriers to Discharge: Insurance Authorization, Other (comment)(Medicaid pending)  Expected Discharge Plan and Services Expected Discharge Plan: Memory Care In-house Referral: Clinical Social Work Discharge Planning Services: CM Consult   Living arrangements for the past 2 months: Assisted Living Facility([facility])                                       Social Determinants of Health (SDOH) Interventions    Readmission Risk Interventions No flowsheet data found.

## 2018-05-20 NOTE — ED Notes (Signed)
Lunch tray placed in room. Pt asleep.

## 2018-05-20 NOTE — ED Notes (Signed)
Pt ambulated to bathroom (x2 assist) and voided. Depend was dry.

## 2018-05-20 NOTE — Progress Notes (Signed)
[facility] Face-to-Face Psychiatry Progress Note   Reason for Consult: Altered mental status Referring Physician: [facility] ED MD Patient Identification: Jeanette Walter MRN:  [id number] Principal Diagnosis: Dementia with behavioral disturbance (HCC) Diagnosis:  Principal Problem:   Dementia with behavioral disturbance (HCC)     Total Time spent with patient: 30 min   Subjective: Patient is unable to participate in reassessment.  Patient is providing limited answers to questions.  In and out of sleep when assessed.  Patient has been psychiatrically clear currently awaits to be transferred to a [facility] which is being facilitated by care management.  Jeanette Walter is a 73 y.o. female patient with advanced dementia presented to [facility] ED via EMS on 04/15/2018 for worsening dementia agitation and combativeness and was found trying to run away from her facility.   The patient has a history of dementia. During her reassessment she was able to answer a few questions appropriately. She has been calm and resting. The patient has been complaint with her medications.   On evaluation, nursing reports that patient has been calm without any issues, and has made no attempts to be physically aggressive.  She remains in her room and will ambulate to her door observing people out in the corridial, she is easily re-directed when it is warrented.  Patient is intermittently compliant with medications, and behavior is more appropriate when she has taken medications.  Patient denies suicidal and homicidal intent.  She denies auditory and visual hallucinations, however this is difficult to assess given her level of dementia. Patient has been compliant and care with physical therapy.  Past Psychiatric History: Dementia Risk to Self:  No Risk to Others:  None currently Prior Inpatient Therapy:  Yes Prior Outpatient Therapy:  Yes  Past Medical History  History reviewed. No pertinent surgical history. Family History: No family  history on file. Family Psychiatric  History:  Social History:

## 2018-05-21 NOTE — ED Notes (Signed)
Report to include Situation, Background, Assessment, and Recommendations received from RN. Patient alert and oriented, warm and dry, in no acute distress. Patient denies SI, HI, AVH and pain. Patient made aware of Q15 minute rounds and Rover and Officer presence for their safety. Patient instructed to come to me with needs or concerns.   

## 2018-05-21 NOTE — ED Notes (Signed)
Hourly rounding reveals patient in room. No complaints, stable, in no acute distress. Q15 minute rounds and monitoring via Rover and Officer to continue.   

## 2018-05-21 NOTE — ED Provider Notes (Signed)
-----------------------------------------   7:15 AM on 05/21/2018 -----------------------------------------   Blood pressure 113/66, pulse 79, temperature 98.6 F (37 C), temperature source Oral, resp. rate 20, weight 65.3 kg, SpO2 98 %.  The patient is calm and cooperative at this time.  There have been no acute events since the last update.  Awaiting disposition plan from Behavioral Medicine team and/or social work.    Loleta Rose, MD 05/21/18 [phone]

## 2018-05-21 NOTE — ED Notes (Addendum)
Patient walked with writer to the end of the nurses station to speak to the receptionist. When walking back to her room, patient attempted to sit on the floor, Clinical research associate and ED technician placed patient in wheelchair and rolled her back to her room. Patient asked for a coke, staff gave her a coke and she took about 4 sips and put the cup aside.

## 2018-05-21 NOTE — Progress Notes (Signed)
PT Cancellation Note  Patient Details Name: Oreal Tikkanen MRN: [id number] DOB: January 28, 1945   Cancelled Treatment:    Reason Eval/Treat Not Completed: Other (comment)(Patient re-attempted, RN reported pt more awake, however pt lethargic and unwilling/resistant to movements. PT will follow up as able.)   Olga Coaster PT, DPT 4:23 PM,05/21/18 [phone]

## 2018-05-21 NOTE — ED Notes (Signed)
Patient ate a few spoonfuls of vanilla ice cream, some with her medications and some after medications were given

## 2018-05-21 NOTE — ED Notes (Signed)
Hourly rounding reveals patient sleeping in room. No complaints, stable, in no acute distress. Q15 minute rounds and monitoring via Security to continue. 

## 2018-05-21 NOTE — ED Notes (Signed)
When patient is ambulating in the hallway please place a face mask on her per Corliss Skains. Charge nurse

## 2018-05-21 NOTE — ED Notes (Signed)
Patient allowed writer to comb her hair, attempted to feed patient some macaronni and cheese and pieces of a breaded chicken cutlet, patient would not eat

## 2018-05-21 NOTE — ED Notes (Signed)
Attempted to feed patient her dinner which consisted of meatloaf and gravy, green beans and mashed potatoes. Patient ate a bite of meatloaf and macaroni and cheese.

## 2018-05-21 NOTE — ED Notes (Signed)
Patient sat in the chair in her room, and is sleeping off and on, and messing with the physical therapy activity given to her

## 2018-05-21 NOTE — Progress Notes (Signed)
PT Cancellation Note  Patient Details Name: Jeanette Walter MRN: [id number] DOB: 22-Jan-1946   Cancelled Treatment:    Reason Eval/Treat Not Completed: Patient declined, no reason specified;Other (comment)(PT entered pt room, pt sleeping heavily. Despite verbal/visual and repositioning, pt unwilling to wake and participate. Verbalized once that she did not want to move, "nah". Pt will follow up as able.)  Olga Coaster PT, DPT 2:22 PM,05/21/18 [phone]

## 2018-05-21 NOTE — ED Notes (Signed)
Pt. Sitting at edge of bed.  Pt. Smiling and appears to be in a good mood.   Pt. Has no needs or concerns at this time.

## 2018-05-21 NOTE — ED Notes (Signed)
Pt. Took nighttime medications with apple sauce.  Pt. Drank about 4 oz of water after medications.

## 2018-05-22 NOTE — TOC Progression Note (Signed)
Transition of Care (TOC) - Progression Note    CSW attempted to contact pt's private case manager. Voicemail left.    Tania Hamlin Devine, LCSW Phone Number: 05/22/2018, 9:31 AM

## 2018-05-22 NOTE — ED Notes (Addendum)
BEHAVIORAL HEALTH ROUNDING Patient sleeping: No. Patient alert : yes Behavior appropriate: Yes.  ; If no, describe:  Nutrition and fluids offered: yes Toileting and hygiene offered: Yes  Sitter present: q15 minute observations and security monitoring Law enforcement present: Yes  ODS  

## 2018-05-22 NOTE — Progress Notes (Signed)
Physical Therapy Treatment Patient Details Name: Jeanette Walter MRN: [id number] DOB: 08/13/1945 Today's Date: 05/22/2018    History of Present Illness Jeanette Walter is a 72 y.o. female with a history of dementia who presented to the ED on 04/14/2018 for aggressive and combative behavior.  She was brought from [facility] independent living with involuntary commitment paperwork after being found trying to run away from her facility.  She has a history of dementia and was unable to provide a reliable history, so information was obtained through chart review.  Her facility has been on lockdown for a suspected coronavirus patient which appears to have triggered increased agitation. She presented two previous times to the ER for AMS in the month prior to this ER admission. has been awaiting placement in geropsychiatric facility for many days while in the ED and is unable to return home at this time or stay with her sister due to lack of ability to provide the care she needs in these settings. PT was ordered due to concern about decline in function while awating placement. She has been combative at times. Relevant past medical history include dementia, HTN.  Chest radiograh shows chronic bronchitic changes.     PT Comments    Patient initially sleeping at start of session, and resistive to movements. When verbally informed that PT wanted the patient to ambulate, pt opened eyes and was amendable to moving limbs, but still needed total assist to supine to sit. Pt performed sit to stand with 2 handheld assist from low surface. Needed 1 UE support throughout standing and ambulation to maintain safety. Pt more confusion this session compared to last. Further ambulation deferred due to increased confusion/mental status. Pt up in chair and able to take small bites of orange from PT. Pt in NAD and RN aware and outside room. Overall the patient continues to demonstrate variable ability to participate with therapy, and no  regression of mobility. Goals were re-addressed and updated this session. The patient would benefit from 3-5 trial PT sessions to assess pts ability to participate as well as ability to maintain continued functional mobility.    Follow Up Recommendations  SNF     Equipment Recommendations  None recommended by PT    Recommendations for Other Services       Precautions / Restrictions Precautions Precautions: Fall;Other (comment) Precaution Comments: AMS, agitation at times, ataxia Restrictions Weight Bearing Restrictions: No    Mobility  Bed Mobility Overal bed mobility: Needs Assistance Bed Mobility: Supine to Sit     Supine to sit: Total assist     General bed mobility comments: Pt heavily sleeping, initially resistive to movements. When asked if she wanted to go for a walk, amendable to moving limbs, but still needed total assist to sit up.  Transfers Overall transfer level: Needs assistance Equipment used: 2 person hand held assist Transfers: Sit to/from Stand Sit to Stand: Min assist         General transfer comment: Pt confused but responded to tactile cues  Ambulation/Gait   Gait Distance (Feet): 10 Feet Assistive device: 1 person hand held assist Gait Pattern/deviations: Staggering right;Staggering left;Trunk flexed;Decreased stride length Gait velocity: decreased   General Gait Details: Pt lethargic compared to previous session. Decreased gait velocity, awareness of orientation. further ambulation held due to confusion. Ambulated to chair   Stairs             Wheelchair Mobility    Modified Rankin (Stroke Patients Only)         Balance Overall balance assessment: Needs assistance Sitting-balance support: Feet supported;Bilateral upper extremity supported Sitting balance-Leahy Scale: Fair                                      Cognition Arousal/Alertness: Lethargic   Overall Cognitive Status: History of cognitive impairments  - at baseline                                        Exercises Other Exercises Other Exercises: Pt sitting in chair, PT assisted pt to eat a few bites of orange. Needed to be handed small bites and step by step instructions.    General Comments        Pertinent Vitals/Pain Pain Assessment: Faces Faces Pain Scale: No hurt    Home Living                      Prior Function            PT Goals (current goals can now be found in the care plan section) Acute Rehab PT Goals PT Goal Formulation: Patient unable to participate in goal setting Time For Goal Achievement: 06/05/18 Progress towards PT goals: Goals met and updated - see care plan    Frequency    Min 2X/week      PT Plan Current plan remains appropriate    Co-evaluation              AM-PAC PT "6 Clicks" Mobility   Outcome Measure  Help needed turning from your back to your side while in a flat bed without using bedrails?: A Lot Help needed moving from lying on your back to sitting on the side of a flat bed without using bedrails?: A Lot Help needed moving to and from a bed to a chair (including a wheelchair)?: A Little Help needed standing up from a chair using your arms (e.g., wheelchair or bedside chair)?: A Little Help needed to walk in hospital room?: A Little Help needed climbing 3-5 steps with a railing? : A Lot 6 Click Score: 15    End of Session Equipment Utilized During Treatment: Gait belt Activity Tolerance: Patient limited by fatigue Patient left: in chair;with nursing/sitter in room Nurse Communication: Mobility status PT Visit Diagnosis: Unsteadiness on feet (R26.81);Other abnormalities of gait and mobility (R26.89);Muscle weakness (generalized) (M62.81)     Time: 1411-1420 PT Time Calculation (min) (ACUTE ONLY): 9 min  Charges:  $Therapeutic Exercise: 8-22 mins                       PT, DPT 3:36 PM,05/22/18 [phone]   

## 2018-05-22 NOTE — ED Notes (Signed)

## 2018-05-22 NOTE — ED Notes (Signed)
Patient observed lying in bed with eyes closed  Even, unlabored respirations observed   NAD pt appears to be sleeping  I will continue to monitor along with every 15 minute visual observations and ongoing security monitoring    

## 2018-05-22 NOTE — ED Notes (Signed)
BEHAVIORAL HEALTH ROUNDING Patient sleeping: No. Patient alert : yes Behavior appropriate: Yes.  ; If no, describe:  Nutrition and fluids offered: yes Toileting and hygiene offered: Yes  Sitter present: q15 minute observations and security monitoring Law enforcement present: Yes  ODS  

## 2018-05-22 NOTE — ED Notes (Signed)
BEHAVIORAL HEALTH ROUNDING Patient sleeping: No. Patient alert and oriented: yes Behavior appropriate: Yes.  ; If no, describe:  Nutrition and fluids offered: yes Toileting and hygiene offered: Yes  Sitter present: q15 minute observations and security  monitoring Law enforcement present: Yes  ODS  

## 2018-05-22 NOTE — ED Notes (Signed)
Hourly rounding reveals patient sleeping in room. No complaints, stable, in no acute distress. Q15 minute rounds and monitoring via Rover and Officer to continue.  

## 2018-05-22 NOTE — ED Notes (Signed)
BEHAVIORAL HEALTH ROUNDING Patient sleeping: Yes.   Patient alert and oriented: eyes closed  Appears to be asleep Behavior appropriate: Yes.  ; If no, describe:  Nutrition and fluids offered: Yes  Toileting and hygiene offered: sleeping Sitter present: q 15 minute observations and security monitoring Law enforcement present: yes  ODS 

## 2018-05-22 NOTE — ED Notes (Signed)
Hourly rounding reveals patient in room. No complaints, stable, in no acute distress. Q15 minute rounds and monitoring via Rover and Officer to continue.   

## 2018-05-22 NOTE — NC FL2 (Addendum)
[place] MEDICAID FL2 LEVEL OF CARE SCREENING TOOL     IDENTIFICATION  Patient Name: Jeanette Walter Birthdate: [birth year] Sex: female Admission Date (Current Location): 04/15/2018  [place] and [place] Number:  Chiropodist and Address:  [facility], [address], [place], [place] [address]      Provider Number: [id number]  Attending Physician Name and Address:  Minna Antis, MD  Relative Name and Phone Number:  [name]; [phone] or Tammy case manager [phone]    Current Level of Care: Hospital Recommended Level of Care: Memory Care, or ALF Prior Approval Number:    Date Approved/Denied:   PASRR Number: [id number]  Discharge Plan: ALF   Current Diagnoses: Patient Active Problem List   Diagnosis Date Noted  . Dementia with behavioral disturbance (HCC)     Orientation RESPIRATION BLADDER Height & Weight     Self, Place  Normal Continent Weight: 144 lb (65.3 kg) Height:     BEHAVIORAL SYMPTOMS/MOOD NEUROLOGICAL BOWEL NUTRITION STATUS  Wanderer, Verbally abusive   Continent Diet  Regular diet  AMBULATORY STATUS COMMUNICATION OF NEEDS Skin   Supervision Verbally Normal                       Personal Care Assistance Level of Assistance  Dressing, Feeding, Bathing Bathing Assistance: Limited assistance Feeding assistance: Independent Dressing Assistance: Limited assistance Total Care Assistance: Limited assistance   Functional Limitations Info  Sight, Hearing, Speech Sight Info: Adequate Hearing Info: Adequate Speech Info: Adequate    SPECIAL CARE FACTORS FREQUENCY  PT (By licensed PT), OT (By licensed OT)     PT Frequency: Minimum 3x a week OT Frequency: Minimum 3x a week            Contractures Contractures Info: Not present    Additional Factors Info  Code Status Code Status Info: Full Code Allergies Info: SEPTRA SULFAMETHOXAZOLE-TRIMETHOPRIM, SULFA ANTIBIOTICS            Current  Medications (05/22/2018):  This is the current hospital active medication list Current Facility-Administered Medications  Medication Dose Route Frequency Provider Last Rate Last Dose  . amLODipine (NORVASC) tablet 5 mg  5 mg Oral Daily Loleta Rose, MD   5 mg at 05/21/18 1100  . aspirin chewable tablet 81 mg  81 mg Oral Daily Loleta Rose, MD   81 mg at 05/21/18 1100  . atenolol (TENORMIN) tablet 50 mg  50 mg Oral Daily Loleta Rose, MD   50 mg at 05/21/18 1100  . divalproex (DEPAKOTE SPRINKLE) capsule 750 mg  750 mg Oral QHS Mariel Craft, MD   750 mg at 05/21/18 2146  . donepezil (ARICEPT) tablet 5 mg  5 mg Oral QHS Loleta Rose, MD   5 mg at 05/21/18 2147  . feeding supplement (ENSURE ENLIVE) (ENSURE ENLIVE) liquid 237 mL  237 mL Oral BID BM Charm Rings, NP   Stopped at 05/21/18 1630  . fluticasone (FLONASE) 50 MCG/ACT nasal spray 1 spray  1 spray Each Nare Daily Loleta Rose, MD   1 spray at 05/21/18 1100  . loratadine (CLARITIN) tablet 10 mg  10 mg Oral Daily Loleta Rose, MD   10 mg at 05/21/18 1100  . LORazepam (ATIVAN) tablet 0.5 mg  0.5 mg Oral BID PRN Charm Rings, NP   0.5 mg at 05/06/18 2210   Or  . LORazepam (ATIVAN) injection 0.5 mg  0.5 mg Intramuscular BID PRN Charm Rings, NP  0.5 mg at 04/26/18 1627  . OLANZapine zydis (ZYPREXA) disintegrating tablet 10 mg  10 mg Oral QHS Mariel CraftMaurer, Sheila M, MD   10 mg at 05/21/18 2147  . OLANZapine zydis (ZYPREXA) disintegrating tablet 2.5 mg  2.5 mg Oral BID WC Mariel CraftMaurer, Sheila M, MD   2.5 mg at 05/21/18 0900  . sertraline (ZOLOFT) tablet 100 mg  100 mg Oral Daily Loleta RoseForbach, Cory, MD   100 mg at 05/21/18 1100  . simvastatin (ZOCOR) tablet 20 mg  20 mg Oral Daily Loleta RoseForbach, Cory, MD   20 mg at 05/21/18 1100   Current Outpatient Medications  Medication Sig Dispense Refill  . donepezil (ARICEPT) 10 MG tablet Take 10 mg by mouth Nightly.    . QUEtiapine (SEROQUEL) 25 MG tablet Take three tablets ( 75 mg) at night for one week then  decrease to two tablets ( 50 mg) nightly.    . QUEtiapine (SEROQUEL) 50 MG tablet Take one tablet nightly along with 25 mg nightly to equal 75 mg nightly.       Discharge Medications: Please see discharge summary for a list of discharge medications.  Relevant Imaging Results:  Relevant Lab Results:   Additional Information SSN [id number] Patient is Medicaid Pending   Kim Lauver, Ervin Knackric R, LCSW

## 2018-05-22 NOTE — ED Notes (Signed)
Patient dry at this time 

## 2018-05-22 NOTE — ED Provider Notes (Signed)
-----------------------------------------   7:19 AM on 05/22/2018 -----------------------------------------   BP 113/66 (BP Location: Left Arm)   Pulse 79   Temp 98.6 F (37 C) (Oral)   Resp 20   Wt 65.3 kg   SpO2 98%   BMI 25.51 kg/m   No acute events overnight. Vitals reviewed. Patient remains medically cleared.  Disposition is pending per Psychiatry/Behavioral Medicine team recommendations.    Jene Every, MD 05/22/18 [phone]

## 2018-05-22 NOTE — ED Notes (Signed)
ED Is the patient under IVC or is there intent for IVC:  no.   Is the patient medically cleared: Yes.   Is there vacancy in the ED BHU: Yes.   Is the population mix appropriate for patient: Yes.   Is the patient awaiting placement in inpatient or outpatient setting: yes  Social work consult in progress  Has the patient had a psychiatric consult: Yes.   Survey of unit performed for contraband, proper placement and condition of furniture, tampering with fixtures in bathroom, shower, and each patient room: Yes.  ; Findings:  APPEARANCE/BEHAVIOR cooperative NEURO ASSESSMENT Orientation: oriented to self  Denies pain Hallucinations: No.None noted (Hallucinations)  Denies  Speech: Normal   Slow to respond   Gait: normal  Stand by assistance to be provided   RESPIRATORY ASSESSMENT Even  Unlabored respirations  CARDIOVASCULAR ASSESSMENT Pulses equal   regular rate  Skin warm and dry   GASTROINTESTINAL ASSESSMENT no GI complaint EXTREMITIES Full ROM  PLAN OF CARE Provide calm/safe environment. Vital signs assessed twice daily. ED BHU Assessment once each 12-hour shift. Collaborate with TTS daily or as condition indicates. Assure the ED provider has rounded once each shift. Provide and encourage hygiene. Provide redirection as needed. Assess for escalating behavior; address immediately and inform ED provider.  Assess family dynamic and appropriateness for visitation as needed: Yes.  ; If necessary, describe findings:  Educate the patient/family about BHU procedures/visitation: Yes.  ; If necessary, describe findings:

## 2018-05-22 NOTE — TOC Progression Note (Addendum)
Transition of Care [facility]) - Progression Note    Patient Details  Name: Jeanette Walter MRN: [id number] Date of Birth: January 20, 1946  Transition of Care [facility]) CM/SW Contact  Darleene Cleaver, [place] Phone Number: 05/22/2018, 3:46 PM  Clinical Narrative:     CSW attempted to contact [facility] worker Salley Hews, [phone] to find out what the patient's Medicaid Pending number is in order to updated placement options.  CSW was informed by Patience Musca, that she does not have a consent to inform CSW what the Medicaid pending number is.  CSW spoke with patient's sister [name] [phone] who is also the HCPOA, she will call Medicaid office and give them consent to speak with CSW.  CSW emailed Patience Musca the consent from [facility] to see if it will be sufficient for release of information.  CSW emailed her at [email or web address].  CSW also called Medicaid worker and left a message on voice mail.  CSW continuing to follow patient's progress throughout discharge planning.  CSW faxed updated clinical information to ALFs and SNFs in [place] area, awaiting for bed offers.     Expected Discharge Plan: Memory Care Barriers to Discharge: Insurance Authorization, Other (comment)(Medicaid pending)  Expected Discharge Plan and Services Expected Discharge Plan: Memory Care In-house Referral: Clinical Social Work Discharge Planning Services: CM Consult   Living arrangements for the past 2 months: Assisted Living Facility([facility])                                       Social Determinants of Health (SDOH) Interventions    Readmission Risk Interventions No flowsheet data found.

## 2018-05-22 NOTE — ED Notes (Signed)
Report to include Situation, Background, Assessment, and Recommendations received from [facility] RN. Patient alert and oriented, warm and dry, in no acute distress. PT. Refuses to answer questions related to SI, HI, AVH and pain. Patient made aware of Q15 minute rounds and Psychologist, counselling presence for their safety. Patient instructed to come to me with needs or concerns.

## 2018-05-22 NOTE — TOC Progression Note (Signed)
Transition of Care (TOC) - Progression Note   CSW attempted to contact DSS worker Cloria Spring Thaxton to obtain Medicaid pending number. Voicemail left again.     Patient Details  Name: Jeanette Walter MRN: [id number] Date of Birth: November 01, 1945  Transition of Care [facility]) CM/SW Contact  Tania Hooper [place], LCSW Phone Number: 05/22/2018, 9:26 AM

## 2018-05-22 NOTE — ED Notes (Addendum)
Physical therapy consult completed  - pt sitting up in recliner chair  - she ate a couple of bites of roast beef and 1/2 container of chocolate ice cream  -  toleting offered

## 2018-05-22 NOTE — ED Notes (Signed)
Pt awakened by physical therapy staff - assistance provided for her to the chair -

## 2018-05-23 ENCOUNTER — Emergency Department: Payer: Medicare Other

## 2018-05-23 LAB — BASIC METABOLIC PANEL
Anion gap: 12 (ref 5–15)
BUN: 20 mg/dL (ref 8–23)
CO2: 29 mmol/L (ref 22–32)
Calcium: 9.3 mg/dL (ref 8.9–10.3)
Chloride: 101 mmol/L (ref 98–111)
Creatinine, Ser: 0.66 mg/dL (ref 0.44–1.00)
GFR calc Af Amer: >60 mL/min (ref >60)
GFR calc non Af Amer: >60 mL/min (ref >60)
Glucose, Bld: 100 mg/dL — ABNORMAL HIGH (ref 70–99)
Potassium: 3.7 mmol/L (ref 3.5–5.1)
Sodium: 142 mmol/L (ref 135–145)

## 2018-05-23 LAB — CBC
HCT: 43.5 % (ref 36.0–46.0)
Hemoglobin: 14.3 g/dL (ref 12.0–15.0)
MCH: 28.7 pg (ref 26.0–34.0)
MCHC: 32.9 g/dL (ref 30.0–36.0)
MCV: 87.3 fL (ref 80.0–100.0)
Platelets: 182 10*3/uL (ref 150–400)
RBC: 4.98 MIL/uL (ref 3.87–5.11)
RDW: 14 % (ref 11.5–15.5)
WBC: 8.8 10*3/uL (ref 4.0–10.5)
nRBC: 0 % (ref 0.0–0.2)

## 2018-05-23 LAB — VALPROIC ACID LEVEL: Valproic Acid Lvl: 23 ug/mL — ABNORMAL LOW (ref 50.0–100.0)

## 2018-05-23 NOTE — ED Notes (Signed)
Hourly rounding reveals patient sleeping in room. No complaints, stable, in no acute distress. Q15 minute rounds and monitoring via Rover and Officer to continue.  

## 2018-05-23 NOTE — ED Notes (Signed)
PT into see pt at this time

## 2018-05-23 NOTE — ED Notes (Signed)
Physical therapy in to work with patient at this time.  Pt drank a couple sips of Ensure.  Pt up walking the halls with PT.

## 2018-05-23 NOTE — ED Notes (Signed)
Myra Shipp called back and was given details about incident.  Myra Shipp stated she has not seen patient due to visitation restrictions.  Ms. Carmela Rima would like to known if patient could call her tomorrow during afternoon.  This nurse would pass on information. Ms. Carlota Raspberry phone # is [phone].

## 2018-05-23 NOTE — ED Notes (Signed)
Pt ambulated to the bathroom with physical therapy.  Used the bathroom with physical therapist.  Currently sitting up in recliner at this time.  Took a couple bits of applesauce with medication.

## 2018-05-23 NOTE — ED Provider Notes (Signed)
-----------------------------------------   5:59 AM on 05/23/2018 -----------------------------------------   Blood pressure (!) 115/56, pulse 69, temperature 98.4 F (36.9 C), temperature source Oral, resp. rate 17, weight 65.3 kg, SpO2 98 %.  The patient is sleeping at this time.  There have been no acute events since the last update.  Awaiting disposition plan from Behavioral Medicine team.   Irean Hong, MD 05/23/18 [phone]

## 2018-05-23 NOTE — Progress Notes (Signed)
Physical Therapy Treatment Patient Details Name: Jeanette Walter MRN: [id number] DOB: Mar 01, 1945 Today's Date: 05/23/2018    History of Present Illness Jeanette Walter is a 73 y.o. female with a history of dementia who presented to the ED on 04/14/2018 for aggressive and combative behavior.  She was brought from [facility] independent living with involuntary commitment paperwork after being found trying to run away from her facility.  She has a history of dementia and was unable to provide a reliable history, so information was obtained through chart review.  Her facility has been on lockdown for a suspected coronavirus patient which appears to have triggered increased agitation. She presented two previous times to the ER for AMS in the month prior to this ER admission. has been awaiting placement in geropsychiatric facility for many days while in the ED and is unable to return home at this time or stay with her sister due to lack of ability to provide the care she needs in these settings. PT was ordered due to concern about decline in function while awating placement. She has been combative at times. Relevant past medical history include dementia, HTN.  Chest radiograh shows chronic bronchitic changes.     PT Comments    Patient very sleepy at first today and required extra time and encouragement to become agreeable to participate in physical therapy. She continues to have limited and intermittent ability to follow commands. She required total A to move from prone to edge of bed sitting. She completed several transfers bed to toilet, toilet to chair, chair to chair with min A and multimodal cuing. She demo sitting prior to lining herself up with chair creating increased fall risk. Patient was more active today. She initially ambulated approx 50 feet to bathroom with RW and min A. Then ambulated to ED door where nursing provided permission and assistance with doors to go outside. Patient pushed aside RW and  continued with min A hand held assist but returned from outside when she found it was windy. She then ambulated further for a total of 250 feet before returning to her chair in her room. After resting, she again ambulated another 100 feet with min A-modA hand held assistance. She required guidance for direction, balance, and at times assistance to forward lean to initate gait. She consistently reached for walls and objects that were close to her as she walked by and attempted to sit several times when fatigued and near a bed requiring mod A to redirect. Patient demonstrating some progress this session. Treatment ended when patient sat in chair, leaned back with eyes closed and failed to respond to any further cuing. Her O2 and HR remained WFL throughout session.   Follow Up Recommendations  SNF     Equipment Recommendations  None recommended by PT    Recommendations for Other Services       Precautions / Restrictions Precautions Precautions: Fall;Other (comment) Precaution Comments: AMS, agitation at times, ataxia Restrictions Weight Bearing Restrictions: No    Mobility  Bed Mobility Overal bed mobility: Needs Assistance Bed Mobility: Rolling;Sidelying to Sit Rolling: Total assist(prone to sidelying) Sidelying to sit: Total assist       General bed mobility comments: Pt heavily sleeping, initially resistive to movements. Required total A to roll and move to sitting. Gradually became more alert and became amenable to standing up.   Transfers Overall transfer level: Needs assistance Equipment used: 2 person hand held assist;Rolling walker (2 wheeled) Transfers: Sit to/from Stand Sit to Stand: Min  assist         General transfer comment: Pt confused but responded to tactile cues abd verbal commands. Started with RW but later in session pushed it aside and did better with hand held assist.   Ambulation/Gait Ambulation/Gait assistance: Min assist;Mod assist Gait Distance (Feet):  250 Feet Assistive device: 1 person hand held assist;Rolling walker (2 wheeled) Gait Pattern/deviations: Staggering right;Staggering left;Trunk flexed;Decreased stride length Gait velocity: inconsistent   General Gait Details: Patient lethargic at first. Initially ambulated approx 50 feet to bathroom with RW and min A. Then ambulated to ED door where nursing provided permission and assistance with doors to go outside. Patient pushed aside RW and continued with min A hand held assist but returned from outside when she found it was windy. She then ambulated further for a total of 250 feet before returning to her chair in her room. After resting, she again ambulated another 100 feet with hand held assistance. She required guidance for direction, balance, and at times assistance to forward lean to initate gait. She consistently reached for walls and objects that were close to her as she walked by and attempted to sit several times when fatigued and near a bed. Ocassionally required mod A to prevent sitting or falling.    Stairs             Wheelchair Mobility    Modified Rankin (Stroke Patients Only)       Balance Overall balance assessment: Needs assistance Sitting-balance support: Feet supported;Bilateral upper extremity supported Sitting balance-Leahy Scale: Good Sitting balance - Comments: prolonged sitting at edge of chair, on toilet, and on edge of bud without trunk support, occasionally using BUE to stablize.    Standing balance support: During functional activity;Bilateral upper extremity supported;Single extremity supported Standing balance-Leahy Scale: Poor Standing balance comment: Patient consistently flexed forward in standing position. Uses RW for stability but also reaches for other objects inappropriately. Without RW requires hand held assist to prevent loss of balance.                             Cognition Arousal/Alertness: Lethargic;Awake/alert   Overall  Cognitive Status: History of cognitive impairments - at baseline                                 General Comments: pt sleeping in prone position at start of session. Very difficult to wake up. Initially resistant to total A rolling and supine to sit. Improved sitting balance with time in seated position. Nursing attempted to provide medications, but patient resistant to opening mouth until later in session. Patient able to follow some commands and became agreeable to mobilize needing verbal/visual/tactile cuies to initiate movements. Session ended when patient no longer agreeable to participate.        Exercises Other Exercises Other Exercises: Patient requied assistance for wiping following urination at the toilet after not being able to follow commands. completed hand washing with manual assistance to put hands under water while standing at sink.  Other Exercises: Seated at edge of chair/bed with no back support for several minutes while nursing attempted to give her medications and ensure drink. Patient eventually took medication and one swallow of ensure. PT again attempted ensure drink but patient refused to drink or hold bottle.     General Comments General comments (skin integrity, edema, etc.): Patient inconsistent with command follow. limited  muscle mass of body noted.       Pertinent Vitals/Pain Pain Assessment: Faces Faces Pain Scale: No hurt    Home Living                      Prior Function            PT Goals (current goals can now be found in the care plan section) Acute Rehab PT Goals PT Goal Formulation: Patient unable to participate in goal setting Time For Goal Achievement: 06/05/18 Progress towards PT goals: PT to reassess next treatment    Frequency    Min 2X/week      PT Plan Current plan remains appropriate    Co-evaluation              AM-PAC PT "6 Clicks" Mobility   Outcome Measure  Help needed turning from your back  to your side while in a flat bed without using bedrails?: A Lot Help needed moving from lying on your back to sitting on the side of a flat bed without using bedrails?: A Lot Help needed moving to and from a bed to a chair (including a wheelchair)?: A Little Help needed standing up from a chair using your arms (e.g., wheelchair or bedside chair)?: A Little Help needed to walk in hospital room?: A Little Help needed climbing 3-5 steps with a railing? : A Lot 6 Click Score: 15    End of Session Equipment Utilized During Treatment: Gait belt Activity Tolerance: Patient limited by fatigue Patient left: in chair;with nursing/sitter in room Nurse Communication: Mobility status PT Visit Diagnosis: Unsteadiness on feet (R26.81);Other abnormalities of gait and mobility (R26.89);Muscle weakness (generalized) (M62.81)     Time: [id number] PT Time Calculation (min) (ACUTE ONLY): 31 min  Charges:  $Gait Training: 8-22 mins $Therapeutic Activity: 8-22 mins                     Luretha MurphySara R. Ilsa IhaSnyder, PT, DPT 05/23/18, 4:34 PM

## 2018-05-23 NOTE — ED Provider Notes (Signed)
Unwitnessed, but found laying on the ground.  She is not able provide history as she is fallen but nursing reports that she will frequently climb out of the bed lowered herself to the floor.  She gives thumbs up, speaks 1-2 words and denies being in pain.  She does not recall falling but also cannot give a history as to how she end up on the ground.  She overall appears generally fatigued but able to be assisted up without evidence of injury.  Atraumatic.  Normocephalic.  No bleeding.  No focal pain or discomfort in long bones.  She appears in no acute distress.  However this is also reported as possibly a second episode she been found on the ground today.  Overt fall precautions, also a sitter.  CT the head also ordered and repeat labs this is been several days, which to make sure she not become acutely dehydrated in the interim.    Ct Head Wo Contrast  Result Date: 05/23/2018 CLINICAL DATA:  Found down.  Head trauma. EXAM: CT HEAD WITHOUT CONTRAST TECHNIQUE: Contiguous axial images were obtained from the base of the skull through the vertex without intravenous contrast. COMPARISON:  04/02/2018 FINDINGS: Brain: There is no mass, hemorrhage or extra-axial collection. The size and configuration of the ventricles and extra-axial CSF spaces are normal. The brain parenchyma is normal, without acute or chronic infarction. Vascular: No abnormal hyperdensity of the major intracranial arteries or dural venous sinuses. No intracranial atherosclerosis. Skull: The visualized skull base, calvarium and extracranial soft tissues are normal. Sinuses/Orbits: No fluid levels or advanced mucosal thickening of the visualized paranasal sinuses. No mastoid or middle ear effusion. The orbits are normal. IMPRESSION: No acute intracranial abnormality. Electronically Signed   By: Deatra Robinson M.D.   On: 05/23/2018 21:19   Ongoing care assigned to Dr. Kennedy Bucker, MD 05/23/18 2311

## 2018-05-23 NOTE — ED Notes (Signed)
Patient dry at this time 

## 2018-05-23 NOTE — ED Notes (Signed)
Pt. Sitting at edge of bed listening to music on tv.  Pt. Smiles when asked "How was your day?".  Pt. Appears calm and happy.  Pt. Has recliner and bed side commode in room.

## 2018-05-23 NOTE — ED Notes (Signed)
Pt having to be redirected back into the bed by this writer, pt speaking to "someone" in the room yet only this tech present

## 2018-05-23 NOTE — ED Notes (Signed)
Pt sleeping. Meal tray placed at bedside. 

## 2018-05-23 NOTE — ED Notes (Signed)
Pt. Found on floor crawling around.  Pt. Appeared to be in no distress.  Pt. Asked "Are you hurt".  Pt. Indicated she was not hurt.  No visible injury noted.  Dr. Fanny Bien aware of patient, orders issued.  Pt. Assisted to feet then to recliner chair.

## 2018-05-23 NOTE — ED Notes (Signed)
Pt provided ensure

## 2018-05-23 NOTE — ED Notes (Signed)
Called patients sister(Myra Shipp) [phone]. Left message on machine for call back.

## 2018-05-24 NOTE — ED Provider Notes (Signed)
-----------------------------------------   4:03 AM on 05/24/2018 -----------------------------------------   Blood pressure (!) 121/59, pulse 82, temperature 97.7 F (36.5 C), temperature source Oral, resp. rate 18, weight 65.3 kg, SpO2 96 %.  The patient is calm and cooperative at this time.  There have been no acute events since the last update.  Awaiting disposition plan from Behavioral Medicine team.    Willy Eddy, MD 05/24/18 [phone]

## 2018-05-24 NOTE — ED Notes (Signed)
Pt. Laying in bed awake watching tv.  Pt. Appears to be in a good mood, smiling.

## 2018-05-24 NOTE — ED Notes (Addendum)
Pt fed a couple bites of grilled cheese sandwich, 4 bites of applesauce, and drank a container of grape juice.

## 2018-05-24 NOTE — ED Notes (Signed)
Pt's diaper dry.  Mouth care provided.  Pt repositioned in bed.  Pt drank several sips of Ensure.

## 2018-05-24 NOTE — ED Notes (Signed)
Pt assisted (x2)  to bedside commode.  Pt urinated in bedside commode.  Diaper changed.  Diaper currently dry.  Pt placed back in bed.

## 2018-05-24 NOTE — ED Notes (Signed)
Pt drank a container of grape juice and drank almost an entire Ensure.  Pt also took a couple bites of a banana.

## 2018-05-25 NOTE — ED Notes (Signed)
Pt given medications in ice cream.  Attempted to feed pt lunch, but pt refused to eat. Maintained on 15 minute checks and observation by security for safety.

## 2018-05-25 NOTE — ED Notes (Signed)
Breakfast tray placed in room. Pt asleep.  

## 2018-05-25 NOTE — ED Notes (Addendum)
Writer combed patients hair, Clinical research associate and patient walked down the hall to the receptionist desk and back, patient wore a mask while ambulating. Patient was cooperative. NAD noted.  While patient and Clinical research associate walked ED technician changed her linens. When Clinical research associate and patient came back to her room patient sat in her chair and asked for water. Writer got patient a cup of water with ice and patient drank 3/4 of her water. Attempted to give patient a slice of pizza. Patient refused. Patient voided on self and Clinical research associate and ED technician changed her diaper.

## 2018-05-25 NOTE — ED Provider Notes (Signed)
-----------------------------------------   6:25 AM on 05/25/2018 -----------------------------------------   Blood pressure 126/60, pulse 72, temperature 98.7 F (37.1 C), temperature source Axillary, resp. rate 18, weight 65.3 kg, SpO2 96 %.  The patient is sleeping at this time.  There have been no acute events since the last update.  Awaiting disposition plan from Behavioral Medicine team.   Irean Hong, MD 05/25/18 [phone]

## 2018-05-25 NOTE — ED Notes (Signed)
Patient is laughing hysterically to self, patient appears to be experiencing auditory and visual hallucinations

## 2018-05-25 NOTE — ED Notes (Signed)
Writer cleaned patients fingernails and attempted to swab her mouth with lemon glycerin swabsticks. Maintained on 15 minute checks and observation by security for safety.

## 2018-05-25 NOTE — ED Notes (Signed)
Patient is making crying noises in her bed, patient does not appear to be crying or tearful, writer asked if she was ok and patient smiled. Patients affect appears brighter

## 2018-05-25 NOTE — ED Notes (Signed)
Patient woke up and began to yell hey little girl, talking to the ceiling, Clinical research associate went in and talked with patient and asked if she was hungry and she asked "what happened". Writer asked if she was ready to eat, she said "surePatent attorney gave her the tray and she picked up a piece of broccoli and ate it. Writer offered her cola and she drank about     3 oz of cola. She then began to pat writer on leg, smiling. Patient saw the officer and ED tech at the door and lifted her head and said "whos at the door" Officer turned around and spoke to her and patient smiled.

## 2018-05-25 NOTE — ED Notes (Signed)
Lunch tray placed in room. Pt asleep.  

## 2018-05-25 NOTE — ED Notes (Signed)
VOL/Pending Placement 

## 2018-05-26 NOTE — ED Notes (Signed)
Resumed care from Sritha Chauncey t rn.  Pt sleeping  siderails up x 2.

## 2018-05-26 NOTE — ED Notes (Signed)
Pt restless attempting to get out of bed multiple times. This tech ambulated pt to the end of the hallway and returned pt back into bed. Pt resting in bed. Will continue to monitor Q15 mins.

## 2018-05-26 NOTE — TOC Progression Note (Addendum)
Transition of Care [facility]) - Progression Note    Patient Details  Name: Jeanette Walter MRN: [id number] Date of Birth: Dec 19, 1945  Transition [facility]) CM/SW Contact  Darleene Cleaver, [place] Phone Number: 05/26/2018, 2:19 PM  Clinical Narrative:     This CSW was informed from EDCSW that [facility] memory care admissions worker Sabino Dick, [phone], requested patient's clinicals to be faxed to her and she also requested to find out what patient's special assistance Medicaid number is.  CSW emailed Medicaid worker Patience Musca Demory regarding the special assistance pending medicaid number, she stated the special assistance Medicaid worker is named Juliette Mangle who will be assigned patient's case.  CSW continuing to follow patient's progress through out discharge planning.  5:00pm  CSW received a phone call back from Juliette Mangle [phone] special assistance Medicaid supervisor, she gave CSW patient's Medicaid reference number which is [id number].  Almira Coaster said that patient's case will be assigned to a different Medicaid worker, she requested a copy of the FL2 to be faxed to DSS, [phone].  Salley Hews will not be following patient's case anymore, Almira Coaster did not have name of assigned worker yet.  CSW faxed requested information, Almira Coaster informed CSW that patient was denied regular Medicaid, however the Special Assistance Medicaid application is still pending.  CSW was informed that the patient's sister applied for the wrong Medicaid initially which is why there has been a delay in progress.  CSW informed Almira Coaster that [facility] is reviewing patient's information and they needed the Medicaid application number.  EDCSW will contact SNF with Medicaid reference number.  CSW to continue to follow patient's progress throughout discharge planning.   Expected Discharge Plan: Memory Care Barriers to Discharge: Insurance Authorization, Other (comment)(Medicaid pending)  Expected Discharge Plan and  Services Expected Discharge Plan: Memory Care In-house Referral: Clinical Social Work Discharge Planning Services: CM Consult   Living arrangements for the past 2 months: Assisted Living Facility([facility])                       Social Determinants of Health (SDOH) Interventions    Readmission Risk Interventions No flowsheet data found.

## 2018-05-26 NOTE — ED Notes (Signed)
Pt sleeping side rail up x 2

## 2018-05-26 NOTE — ED Notes (Signed)
BEHAVIORAL HEALTH ROUNDING Patient sleeping: Yes.   Patient alert and oriented: eyes closed  Appears to be asleep Behavior appropriate: Yes.  ; If no, describe:  Nutrition and fluids offered: Yes  Toileting and hygiene offered: sleeping Sitter present: q 15 minute observations and security monitoring Law enforcement present: yes  ODS 

## 2018-05-26 NOTE — Progress Notes (Signed)
PT Cancellation Note  Patient Details Name: Jeanette Walter MRN: [id number] DOB: 1945-03-12   Cancelled Treatment:    Reason Eval/Treat Not Completed: Fatigue/lethargy limiting ability to participate. Treatment attempted; pt soundly sleeping and unable to awaken; nurse notes pt does not wake up normally until after 2 p.m. Re attempt at a later time, as the schedule allows.    Scot Dock, PTA 05/26/2018, 12:53 PM

## 2018-05-26 NOTE — ED Notes (Signed)
meds given in apple sauce tonight.

## 2018-05-26 NOTE — Progress Notes (Signed)
PT Cancellation Note  Patient Details Name: Jeanette Walter MRN: [id number] DOB: 03/14/1945   Cancelled Treatment:    Reason Eval/Treat Not Completed: Other (comment). Treatment attempted again this afternoon; pt found sitting at the edge of bed with face buried in hands. Pt response to greeting/question is "it doesn't feel well", but does not respond further to questioning to determine what does not feel well. Nurse entered room and states pt earlier had asked for a cup of water, which was provided, but pt has not touched or moved from current position. Nurse and therapist both attempted to interact with pt; however, pt initiated self bed mobility to a side lying fetal position with assist provided to lower extremities. Nursing to obtain floor mats for pt safety. Re attempt at another date.    Scot Dock, PTA 05/26/2018, 3:04 PM

## 2018-05-26 NOTE — TOC Progression Note (Signed)
Transition of Care [facility]) - Progression Note    Patient Details  Name: Adassa Neidhart MRN: [id number] Date of Birth: 1945-05-25  Transition of Care [facility]) CM/SW Contact  Tania Sianna Garofano, LCSW Phone Number: 05/26/2018, 11:11 AM  Clinical Narrative:    CSW spoke with Ovidio Hanger [phone]) from [place].Sabino Dick stated that she was just informed about pt (via pt's private case manager, Tammy) and will need FL2, psych and PT notes faxed to her [phone]).  Danita shared that pt will need to begin a Special Assistance application through DSS, and [facility] will not be able to accept her until pt has a pending number.   CSW will reach out to pt's DSS worker, and fax recent notes.     Expected Discharge Plan: Memory Care Barriers to Discharge: Insurance Authorization, Other (comment)(Medicaid pending)  Expected Discharge Plan and Services Expected Discharge Plan: Memory Care In-house Referral: Clinical Social Work Discharge Planning Services: CM Consult   Living arrangements for the past 2 months: Assisted Living Facility([facility])                                       Social Determinants of Health (SDOH) Interventions    Readmission Risk Interventions No flowsheet data found.

## 2018-05-26 NOTE — TOC Transition Note (Addendum)
Transition of Care [facility]) - CM/SW Progression Note   Patient Details  Name: Jeanette Walter MRN: [id number] Date of Birth: [birth year]  Transition of Care [facility]) CM/SW Contact:  Cala Bradford, LCSW Phone Number: 05/26/2018, 12:54 PM   Clinical Narrative:    CSW faxed most recent clinicals to [employer] at [place].      Barriers to Discharge: English as a second language teacher, Other (comment)(Medicaid pending)   Patient Goals and CMS Choice        Discharge Placement                       Discharge Plan and Services In-house Referral: Clinical Social Work Discharge Planning Services: CM Consult                                 Social Determinants of Health (SDOH) Interventions     Readmission Risk Interventions No flowsheet data found.

## 2018-05-26 NOTE — ED Notes (Signed)
BEHAVIORAL HEALTH ROUNDING Patient sleeping: No. Patient alert : yes Behavior appropriate: Yes.  ; If no, describe:  Nutrition and fluids offered: yes Toileting and hygiene offered: Yes  Sitter present: q15 minute observations and security monitoring Law enforcement present: Yes  ODS  

## 2018-05-26 NOTE — ED Notes (Signed)
She drank a few sips of her ensure

## 2018-05-26 NOTE — ED Provider Notes (Signed)
-----------------------------------------   5:56 AM on 05/26/2018 -----------------------------------------   Blood pressure 106/87, pulse 78, temperature 98.2 F (36.8 C), temperature source Oral, resp. rate 18, weight 65.3 kg, SpO2 100 %.  The patient is calm and cooperative at this time.  There have been no acute events since the last update.  Awaiting disposition plan from Behavioral Medicine team.   Dionne Bucy, MD 05/26/18 [phone]

## 2018-05-26 NOTE — ED Notes (Signed)
Pt sitting in recliner.  Pt awake.

## 2018-05-26 NOTE — ED Notes (Signed)
She is awake - sitting on the side of hte bed  She verbalizes  "I don't feel good - can I have some water?"  Water provided  assistance provided for her to lie back down  Physical therapy is at her bedside

## 2018-05-27 MED ORDER — ZINC OXIDE 40 % EX OINT
TOPICAL_OINTMENT | Freq: Two times a day (BID) | CUTANEOUS | Status: DC
  Administered 2018-05-28: 1 via TOPICAL
  Administered 2018-05-29 (×2): via TOPICAL
  Administered 2018-05-30: 1 via TOPICAL
  Administered 2018-06-01 – 2018-06-05 (×7): via TOPICAL
  Administered 2018-06-06 – 2018-06-07 (×2): 1 via TOPICAL
  Administered 2018-06-08 – 2018-06-10 (×2): via TOPICAL
  Administered 2018-06-11: 1 via TOPICAL
  Filled 2018-05-27 (×3): qty 113

## 2018-05-27 NOTE — ED Provider Notes (Signed)
-----------------------------------------   10:10 AM on 05/27/2018 -----------------------------------------   BP (!) 97/41 (BP Location: Left Arm)   Pulse 72   Temp 97.7 F (36.5 C) (Oral)   Resp 20   Wt 65.3 kg   SpO2 93%   BMI 25.51 kg/m   No acute events overnight. Vitals reviewed. Patient remains medically cleared.  Disposition is pending per Psychiatry/Behavioral Medicine team recommendations.    Jene Every, MD 05/27/18 [id number]

## 2018-05-27 NOTE — ED Notes (Signed)
Writer attempted to feed patient some dinner, patient ate a piece of [place] and some applesauce. Patient sat up and allowed writer to comb her hair. "Patient asked if I had any children, and then I asked her if she had any children and she said "no". Then smiled and turned her head and talk to a person that was not there, writer could not understand it

## 2018-05-27 NOTE — TOC Progression Note (Signed)
Transition of Care [facility]) - Progression Note    Patient Details  Name: Jeanette Walter MRN: [id number] Date of Birth: 1945-05-04  Transition of Care [facility]) CM/SW Contact  Tania Toniette Devera, LCSW Phone Number: 05/27/2018, 1:35 PM  Clinical Narrative:    CSW attempted to contact Danita Thompson/[facility] three times. Voicemail left.

## 2018-05-27 NOTE — ED Notes (Signed)
Patient dry at this time 

## 2018-05-27 NOTE — Progress Notes (Signed)
PT Cancellation Note  Patient Details Name: Jeanette Walter MRN: [id number] DOB: October 25, 1945   Cancelled Treatment:     Spoke with nursing who reports that patient "usually wakes up around 4, I just tried to wake her up."  PT attempted to wake her up just before 16:00 and she was soundly sleeping in bed in prone. Despite loudly calling her first and last name and shoulder, leg and hand rub she did not move or wake.  Pt unable to participate with PT this date.   Malachi Pro, DPT 05/27/2018, 4:20 PM

## 2018-05-27 NOTE — ED Notes (Signed)
Hourly rounding reveals patient sleeping in room. No complaints, stable, in no acute distress. Q15 minute rounds and monitoring via Security to continue. 

## 2018-05-27 NOTE — TOC Progression Note (Signed)
Transition of Care [facility]) - Progression Note    Patient Details  Name: Jeanette Walter MRN: [id number] Date of Birth: [birth year]  Transition of Care [facility]) CM/SW Contact  Tania Lilyth Lawyer, LCSW Phone Number: 05/27/2018, 9:45 AM  Clinical Narrative:    9:31a  CSW attempted to contact Ovidio Hanger at [employer] to provide [facility] reference number. Voicemail was left.

## 2018-05-27 NOTE — ED Notes (Signed)
Patient dry at this time,

## 2018-05-27 NOTE — ED Notes (Signed)
Report off to jennifer I rn.  

## 2018-05-27 NOTE — ED Notes (Addendum)
PT came to attempt to work with the patient and patient would not get up, she just smiled.

## 2018-05-28 NOTE — ED Notes (Signed)
BEHAVIORAL HEALTH ROUNDING Patient sleeping: Yes.   Patient alert and oriented: eyes closed  Appears to be asleep Behavior appropriate: Yes.  ; If no, describe:  Nutrition and fluids offered: Yes  Toileting and hygiene offered: sleeping Sitter present: q 15 minute observations and security monitoring Law enforcement present: yes  ODS 

## 2018-05-28 NOTE — ED Notes (Signed)
Pt. Is currently sleeping in bed.

## 2018-05-28 NOTE — ED Provider Notes (Signed)
-----------------------------------------   3:41 AM on 05/28/2018 -----------------------------------------   Blood pressure (!) 96/49, pulse 67, temperature 98.9 F (37.2 C), temperature source Oral, resp. rate 17, weight 65.3 kg, SpO2 90 %.  The patient is calm and cooperative at this time.  There have been no acute events since the last update.  The patient is now in her 43rd day in the emergency department.   Loleta Rose, MD 05/28/18 [phone]

## 2018-05-28 NOTE — ED Notes (Signed)
Patient sleep most of day. Woke patient to assist with help to the bedside commode. patietn soiled bedding. geve patient a bedside bath and changed into to clean scrubs and linen changed.AS

## 2018-05-28 NOTE — TOC Progression Note (Addendum)
Transition of Care [facility]) - Progression Note    Patient Details  Name: Jeanette Walter MRN: [id number] Date of Birth: 02/10/1945  Transition of Care [facility]) CM/SW Contact  Tania Laquitta Dominski, LCSW Phone Number: 05/28/2018, 9:29 AM  Clinical Narrative:    CSW contacted Ovidio Hanger at [facility] regarding placement for this pt. CSW provided Danita with the Medicaid reference # [id number] and the number for the DSS Supervisor over Special Assistance Medicaid, Juliette Mangle [phone].   Ovidio Hanger confirmed that she received updated clinicals faxed on 05/26/2018, and will have someone in her office review it. Danita stated she will let CSW know their decision "real soon."   12:23pm - Pt's private case manager, Tammy called CSW asking for an update. Tammy was informed that CSW is still waiting for [facility] to decide on whether or not they will take pt. Tammy asked CSW to update her once she is aware of their decision.   Expected Discharge Plan: Memory Care Barriers to Discharge: Insurance Authorization, Other (comment)(Medicaid pending)  Expected Discharge Plan and Services Expected Discharge Plan: Memory Care In-house Referral: Clinical Social Work Discharge Planning Services: CM Consult   Living arrangements for the past 2 months: Assisted Living Facility([facility])                                       Social Determinants of Health (SDOH) Interventions    Readmission Risk Interventions No flowsheet data found.

## 2018-05-28 NOTE — ED Notes (Signed)
Patient dry at this time 

## 2018-05-28 NOTE — ED Notes (Signed)
Patient observed lying in bed with eyes closed  Even, unlabored respirations observed   NAD pt appears to be sleeping  I will continue to monitor along with every 15 minute visual observations and ongoing security monitoring    

## 2018-05-28 NOTE — Progress Notes (Signed)
PT Cancellation Note  Patient Details Name: Jeanette Walter MRN: [id number] DOB: August 21, 1945   Cancelled Treatment:    Reason Eval/Treat Not Completed: Other (comment)(sleeping) Patient unable to awaken this afternoon  Staci Acosta PT, DPT Staci Acosta 05/28/2018, 3:46 PM

## 2018-05-28 NOTE — ED Notes (Signed)

## 2018-05-28 NOTE — ED Notes (Signed)
VOL/Pending Placement by CSW 

## 2018-05-29 NOTE — ED Notes (Signed)
BEHAVIORAL HEALTH ROUNDING Patient sleeping: No. Patient alert : yes Behavior appropriate: Yes.  ; If no, describe:  Nutrition and fluids offered: yes Toileting and hygiene offered: Yes  Sitter present: q15 minute observations and security monitoring Law enforcement present: Yes  ODS  

## 2018-05-29 NOTE — ED Notes (Signed)
Pt took several sips of Ensure and a couple bites of a french fry.

## 2018-05-29 NOTE — ED Notes (Signed)
Pt's brief checked.  Brief dry.   

## 2018-05-29 NOTE — ED Notes (Signed)
ED  Is the patient under IVC or is there intent for IVC:  Voluntary  -   Is the patient medically cleared: Yes.   Is there vacancy in the ED BHU:  Dementia    Is the population mix appropriate for patient: Yes.   Is the patient awaiting placement in inpatient or outpatient setting: Yes.  Social work consult is in progress   Has the patient had a psychiatric consult: Yes.   Survey of unit performed for contraband, proper placement and condition of furniture, tampering with fixtures in bathroom, shower, and each patient room: Yes.  ; Findings:  APPEARANCE/BEHAVIOR  cooperative NEURO ASSESSMENT Orientation: oriented to self   Hallucinations: No.None noted (Hallucinations) Speech: Normal Gait: normal  Ambulates well with a walker  - walker is at her side  RESPIRATORY ASSESSMENT Even  Unlabored respirations  CARDIOVASCULAR ASSESSMENT Pulses equal   regular rate  Skin warm and dry   GASTROINTESTINAL ASSESSMENT no GI complaint EXTREMITIES Full ROM  PLAN OF CARE Provide calm/safe environment. Vital signs assessed twice daily. ED BHU Assessment once each 12-hour shift. Collaborate with TTS daily or as condition indicates. Assure the ED provider has rounded once each shift. Provide and encourage hygiene. Provide redirection as needed. Assess for escalating behavior; address immediately and inform ED provider.  Assess family dynamic and appropriateness for visitation as needed: Yes.  ; If necessary, describe findings:  Educate the patient/family about BHU procedures/visitation: Yes.  ; If necessary, describe findings:

## 2018-05-29 NOTE — ED Notes (Signed)
Checked patient at this time patient dry.

## 2018-05-29 NOTE — ED Notes (Signed)
Pt ambulatory with her walker and assistance from EDT from bed to the recliner chair  -  Warm blanket provided  EDT provided banana and some water  Pt smiling at her  NAD observed

## 2018-05-29 NOTE — Progress Notes (Signed)
PT Cancellation Note  Patient Details Name: Jeanette Walter MRN: [id number] DOB: Jun 20, 1945   Cancelled Treatment:    Reason Eval/Treat Not Completed: Other (comment)(sleeping per nursing)Will attempt again at later time/date.   Precious Bard, PT, DPT   05/29/2018, 3:19 PM

## 2018-05-29 NOTE — ED Notes (Signed)

## 2018-05-29 NOTE — ED Notes (Signed)
Pt's diaper changed.

## 2018-05-29 NOTE — Progress Notes (Signed)
PT Cancellation Note  Patient Details Name: Amparo Kanagy MRN: [id number] DOB: [birth year]   Cancelled Treatment:    Reason Eval/Treat Not Completed: Other (comment);Fatigue/lethargy limiting ability to participate(sleeping, upon attempt to wake up patient, patient pulled covers over head. )Will attempt again at later time/date.   Precious Bard, PT, DPT   05/29/2018, 4:18 PM

## 2018-05-29 NOTE — ED Notes (Signed)
Patient dry at this time 

## 2018-05-29 NOTE — ED Notes (Signed)
Physical therapy in to work with patient, but patient refused due to pt sleeping.

## 2018-05-29 NOTE — TOC Progression Note (Signed)
Transition of Care [facility]) - Progression Note    Patient Details  Name: Jeanette Walter MRN: [id number] Date of Birth: 1945-03-13  Transition of Care [facility]) CM/SW Contact  Tania Adonica Fukushima, LCSW Phone Number: [phone] (Mon-Thurs) 05/29/2018, 2:55 PM  Clinical Narrative:     CSW attempted to contact Ovidio Hanger at [place] to inquire on placement for pt. Voicemail left, with CSW phone number provided.

## 2018-05-30 NOTE — ED Notes (Signed)
BEHAVIORAL HEALTH ROUNDING Patient sleeping: No. Patient alert : yes Behavior appropriate: Yes.  ; If no, describe:  Nutrition and fluids offered: yes Toileting and hygiene offered: Yes  Sitter present: q15 minute observations and security monitoring Law enforcement present: Yes  ODS  

## 2018-05-30 NOTE — ED Provider Notes (Signed)
-----------------------------------------   5:41 AM on 05/30/2018 -----------------------------------------   Blood pressure 118/60, pulse 80, temperature 98.4 F (36.9 C), temperature source Oral, resp. rate 18, weight 65.3 kg, SpO2 95 %.  The patient is calm and cooperative at this time.  There have been no acute events since the last update.  Awaiting disposition plan from Behavioral Medicine and social work teams.   Loleta Rose, MD 05/30/18 [phone]

## 2018-05-30 NOTE — ED Notes (Signed)
Patient dry at this time 

## 2018-05-30 NOTE — ED Notes (Signed)
Patient patients diaper changed by Clinical research associate and ED tech

## 2018-05-30 NOTE — ED Notes (Signed)
VOL/CSW placement 

## 2018-05-30 NOTE — ED Notes (Signed)
BEHAVIORAL HEALTH ROUNDING Patient sleeping: Yes.   Patient alert and oriented: eyes closed  Appears to be asleep Behavior appropriate: Yes.  ; If no, describe:  Nutrition and fluids offered: Yes  Toileting and hygiene offered: sleeping Sitter present: q 15 minute observations and security monitoring Law enforcement present: yes  ODS 

## 2018-05-30 NOTE — ED Notes (Signed)
Patient currently laying in bed resting with eyes closed. No distress noted. Will continue to monitor.

## 2018-05-30 NOTE — TOC Progression Note (Deleted)
Transition of Care [facility]) - Progression Note    Patient Details  Name: Jeanette Walter MRN: [id number] Date of Birth: 1945/08/23  Transition of Care [facility]) CM/SW Contact  Elliot Gault, LCSW Phone Number: 05/30/2018, 11:03 AM  Clinical Narrative:     Following up on placement issues. Spoke with [name] at [facility] today to inquire on pt's ability to return to the facility. Per [name], they are unable to accept any patients at this time due to the number of COVID positive cases in their facility and they are using all of their quarantine unit beds.   Updated granddaughter, [name], and sent referrals to additional facilities at her request. Awaiting determination.   Expected Discharge Plan: Memory Care Barriers to Discharge: Insurance Authorization, Other (comment)(Medicaid pending)  Expected Discharge Plan and Services Expected Discharge Plan: Memory Care In-house Referral: Clinical Social Work Discharge Planning Services: CM Consult   Living arrangements for the past 2 months: Assisted Living Facility([facility])                                       Social Determinants of Health (SDOH) Interventions    Readmission Risk Interventions No flowsheet data found.

## 2018-05-30 NOTE — ED Notes (Signed)
Encouraged patient to eat her dinner which was pot roast, mashed potatoes and broccoli. Writer talked to patient about her sister Myra and let patient know (jokingly) if she didn't eat I was going to talk to Myra, patient then said give me a piece of broccoli and she ate a piece of broccoli. Writer also offered her a sugar cookie, patient took one of the cookies out of the cup and ate it. Patient then stated "that's enough"

## 2018-05-30 NOTE — ED Notes (Signed)
She has been awake this shift - rambling in the hallway - attempting to enter others rooms  Med to be administered

## 2018-05-30 NOTE — Progress Notes (Signed)
Physical Therapy Discharge Patient Details Name: Jeanette Walter MRN: [id number] DOB: [birth year] Today's Date: 05/30/2018 Time:  -    Please see latest therapy progress note for current level of functioning and progress toward goals.    Progress and discharge plan discussed with patient and/or caregiver: Patient unable to participate in discharge planning and no caregivers available   Patient unable to participate in therapy over multiple days.   GP    Staci Acosta PT, DPT  Staci Acosta 05/30/2018, 2:34 PM

## 2018-05-30 NOTE — ED Notes (Signed)
Patient refused to let me check to see if she was wet at this time.

## 2018-05-30 NOTE — ED Notes (Signed)
Spoke to patient sister Jeanette Walter and she asked about patients stay, she said she wanted to know what the process is going to be and has anyone heard about patient leaving. Mrs Jeanette Walter wanted to know if she could see her sister and reassess her, since she knows her. Sister says she feels helpless, she says her sister used to eat all the food on her plate, and she used to have a lot of life in her.

## 2018-05-30 NOTE — ED Notes (Signed)
Patient observed lying in bed with eyes closed  Even, unlabored respirations observed   NAD pt appears to be sleeping  I will continue to monitor along with every 15 minute visual observations     

## 2018-05-30 NOTE — TOC Progression Note (Addendum)
Transition of Care [facility]) - Progression Note    Patient Details  Name: Jeanette Walter MRN: [id number] Date of Birth: [birth year]  Transition of Care [facility]) CM/SW Contact  Elliot Gault, LCSW Phone Number: 05/30/2018, 11:17 AM  Clinical Narrative:     Following up on pt's transition of care needs. Contacted [facility] in [place] to attempt to speak with Danita who is the admissions contact there. Informed that Sabino Dick was with a resident and unavailable to take the call. Left voicemail message for Danita requesting return call with update. Will follow.  11:35 Received return call from Danita at [facility] stating that they are awaiting the Medicaid Special Assistance determination from [facility] DSS before they can make a decision about offering admission to pt.  Per Danita, they have left voicemail messages over the last couple of days for DSS Supervisor Juliette Mangle requesting contact regarding the status of this application. They have not yet received a return call per Danita. This LCSW attempted to reach Gina at [phone]. Voicemail message left requesting return call. Will follow.  Expected Discharge Plan: Memory Care Barriers to Discharge: Insurance Authorization, Other (comment)(Medicaid pending)  Expected Discharge Plan and Services Expected Discharge Plan: Memory Care In-house Referral: Clinical Social Work Discharge Planning Services: CM Consult   Living arrangements for the past 2 months: Assisted Living Facility([facility])                                       Social Determinants of Health (SDOH) Interventions    Readmission Risk Interventions No flowsheet data found.

## 2018-05-30 NOTE — Progress Notes (Addendum)
PT Cancellation Note  Patient Details Name: Jeanette Walter MRN: [id number] DOB: 06/10/1945   Cancelled Treatment:    Reason Eval/Treat Not Completed: Other (comment);Fatigue/lethargy limiting ability to participate Patient asleep and unable to be aroused, pulling covers over her head. Told patient PT will not be able to work with her if she is unwilling to participate without prevail   Staci Acosta PT, DPT Staci Acosta 05/30/2018, 2:33 PM

## 2018-05-31 LAB — BASIC METABOLIC PANEL
Anion gap: 11 (ref 5–15)
BUN: 11 mg/dL (ref 8–23)
CO2: 24 mmol/L (ref 22–32)
Calcium: 8.8 mg/dL — ABNORMAL LOW (ref 8.9–10.3)
Chloride: 104 mmol/L (ref 98–111)
Creatinine, Ser: 0.52 mg/dL (ref 0.44–1.00)
GFR calc Af Amer: >60 mL/min (ref >60)
GFR calc non Af Amer: >60 mL/min (ref >60)
Glucose, Bld: 89 mg/dL (ref 70–99)
Potassium: 3.2 mmol/L — ABNORMAL LOW (ref 3.5–5.1)
Sodium: 139 mmol/L (ref 135–145)

## 2018-05-31 LAB — CBC WITH DIFFERENTIAL/PLATELET
Abs Immature Granulocytes: 0.02 10*3/uL (ref 0.00–0.07)
Basophils Absolute: 0.1 10*3/uL (ref 0.0–0.1)
Basophils Relative: 1 %
Eosinophils Absolute: 1.3 10*3/uL — ABNORMAL HIGH (ref 0.0–0.5)
Eosinophils Relative: 18 %
HCT: 40.4 % (ref 36.0–46.0)
Hemoglobin: 13.7 g/dL (ref 12.0–15.0)
Immature Granulocytes: 0 %
Lymphocytes Relative: 24 %
Lymphs Abs: 1.7 10*3/uL (ref 0.7–4.0)
MCH: 29.2 pg (ref 26.0–34.0)
MCHC: 33.9 g/dL (ref 30.0–36.0)
MCV: 86.1 fL (ref 80.0–100.0)
Monocytes Absolute: 0.9 10*3/uL (ref 0.1–1.0)
Monocytes Relative: 13 %
Neutro Abs: 3.3 10*3/uL (ref 1.7–7.7)
Neutrophils Relative %: 44 %
Platelets: 172 10*3/uL (ref 150–400)
RBC: 4.69 MIL/uL (ref 3.87–5.11)
RDW: 13.9 % (ref 11.5–15.5)
WBC: 7.3 10*3/uL (ref 4.0–10.5)
nRBC: 0 % (ref 0.0–0.2)

## 2018-05-31 LAB — URINALYSIS, COMPLETE (UACMP) WITH MICROSCOPIC
Bacteria, UA: NONE SEEN
Bilirubin Urine: NEGATIVE
Glucose, UA: NEGATIVE mg/dL
Hgb urine dipstick: NEGATIVE
Ketones, ur: 20 mg/dL — AB
Leukocytes,Ua: NEGATIVE
Nitrite: NEGATIVE
Protein, ur: 30 mg/dL — AB
Specific Gravity, Urine: 1.027 (ref 1.005–1.030)
pH: 5 (ref 5.0–8.0)

## 2018-05-31 NOTE — ED Notes (Signed)
voluntary 

## 2018-05-31 NOTE — ED Provider Notes (Signed)
-----------------------------------------   6:23 AM on 05/31/2018 -----------------------------------------   Blood pressure 101/70, pulse 69, temperature 98.4 F (36.9 C), temperature source Oral, resp. rate 16, weight 65.3 kg, SpO2 96 %.  The patient is sleeping at this time.  There have been no acute events since the last update.  Awaiting disposition plan from social work team.   Irean Hong, MD 05/31/18 [phone]

## 2018-05-31 NOTE — ED Notes (Signed)
Dr. Siadecki at bedside 

## 2018-05-31 NOTE — Social Work (Signed)
TOC CM/SW received social work consult regarding patient.  Attending shared his concern regarding patient's rapid decline due to extended stay in ED. Clinician explained that SNF bed request has been sent out to over 65 facilities.  30 facilities have declined this patient.    Plan:  Resend bed offers to facilities that have declined the patient in recent weeks. Behavioral issues appears to have resolved due to patient's inability to function at full capacity.    Larwance Rote, MSW, LCSW  [phone] 8am-6pm (weekends) or CSW ED # [phone]

## 2018-05-31 NOTE — ED Notes (Signed)
This EDT tried to wake pt for supper. Pt was unable to be awoken at this time. Pt pulled up blanket and did not open eyes.

## 2018-05-31 NOTE — ED Notes (Addendum)
Patient was reposition to lie on right side. Pillow was placed behind the patient's back and a blanket was placed between the patient's knees to protect skin integrity. Patient did not open eyes, but did withdraw from touch. MD aware.

## 2018-05-31 NOTE — ED Notes (Signed)
Report given to Laura C RN 

## 2018-05-31 NOTE — ED Provider Notes (Signed)
-----------------------------------------   3:06 PM on 05/31/2018 -----------------------------------------  Patient remains clinically stable with no acute changes, although her overall functionality seems to be declining.  The patient is no longer getting out of bed and has not eaten anything today.  I reassessed her.  She is awake although not talking to me.  She is able to move all extremities.  There is no visible trauma.  Mucous membranes are moist.  Since she has not had lab work-up in the last week, I obtained basic labs and a UA.  These show no acute abnormalities.  The vital signs remained stable.  Patient is still pending disposition.   Dionne Bucy, MD 05/31/18 [phone]

## 2018-05-31 NOTE — ED Notes (Signed)
Patient resting in bed with eyes closed. Even, unlabored respirations noted. Patient will not follow commands for this RN. Patient not responding to voice. Patient does withdraw from painful stimuli. MD made aware.

## 2018-05-31 NOTE — ED Notes (Signed)
Patient tolerated In and out cath well, but did grimace and moan with repositioning. Peri-care performed. Patient had redness in perineal area.

## 2018-05-31 NOTE — ED Provider Notes (Signed)
Patient continues to deteriorate.  She is not a threat to herself or anyone else, she cannot perform any activities of daily living without significant assistance.  I am concerned that she continues to get weaker and weaker here.  Although she physically continues to deteriorate her labs are normal and she does not meet any criteria for hospital admission.  We will continue to stress social work placement into a facility.   Emily Filbert, MD 05/31/18 [phone]

## 2018-05-31 NOTE — ED Notes (Addendum)
Patient's linens were changed, new chux and briefs in place. Patient was repositioned from lying on right side to lying on left side. Patient did not open eyes during procedure, but did cooperate with commands. Will attempt to give meds in chocolate ice cream which has worked in the past.

## 2018-06-01 DIAGNOSIS — R410 Disorientation, unspecified: Secondary | ICD-10-CM

## 2018-06-01 MED ORDER — LORATADINE 10 MG PO TABS
10.0000 mg | ORAL_TABLET | Freq: Every day | ORAL | Status: DC | PRN
  Filled 2018-06-01: qty 1

## 2018-06-01 MED ORDER — FLUTICASONE PROPIONATE 50 MCG/ACT NA SUSP
1.0000 | Freq: Every day | NASAL | Status: DC | PRN
  Filled 2018-06-01: qty 16

## 2018-06-01 MED ORDER — OLANZAPINE 5 MG PO TBDP: 2.5000 mg | ORAL_TABLET | Freq: Three times a day (TID) | ORAL | Status: DC | PRN

## 2018-06-01 NOTE — ED Notes (Signed)
Patient is positioned on her left side which she was able to do by herself and she is also moving her foot lightly to the music

## 2018-06-01 NOTE — ED Notes (Signed)
Writer and ED technician Lein moved patient up in the bed, patient looked at staff and Clinical research associate and busted out crying, Clinical research associate asked patient what was wrong and she kept crying, writing held patients hand and wiped her eyes. Patient had a far away look as tears rolled down her face.

## 2018-06-01 NOTE — ED Notes (Addendum)
Hourly rounding reveals patient sleeping in room. No complaints, stable, in no acute distress. Q15 minute rounds and monitoring via Security to continue. Patient is lying on her back with a pillow at her side.

## 2018-06-01 NOTE — ED Notes (Signed)
Hourly rounding reveals patient sleeping in room. No complaints, stable, in no acute distress. Q15 minute rounds and monitoring via Rover and Officer to continue.  

## 2018-06-01 NOTE — ED Notes (Signed)
Patient walked with Clinical research associate up the hall to the secretary desk and back, patient stopped once and walked back to room with no issues.   Patient took 3 sips of chocolate Ensure, and a swallow of apple juice and laid in the bed

## 2018-06-01 NOTE — ED Notes (Signed)
Patient perked up a little when Clinical research associate called her "sweet girl" she opened her eyes and grabbed writers hand when Clinical research associate reached out to patient, when Clinical research associate was sitting at patients bedside patient saw a diaper commercial with babies and pointed at the television and smiled. Writer saw patient shivering and clenching her teeth, asked patient if she was cold and she said "yeah" patient given another blanket.  Patient rolled over and attempted to go back to sleep, she refused her lunch tray [place] with rice.

## 2018-06-01 NOTE — ED Notes (Signed)
VOL/CSW placement 

## 2018-06-01 NOTE — ED Provider Notes (Signed)
-----------------------------------------   6:11 AM on 06/01/2018 -----------------------------------------   Blood pressure 115/67, pulse (!) 18, temperature (!) 97.1 F (36.2 C), temperature source Oral, resp. rate 18, weight 65.3 kg, SpO2 96 %.  The patient is calm and cooperative at this time.  There have been no acute events since the last update.  Awaiting disposition plan from Behavioral Medicine team.  Repeat lab work from yesterday shows no acute abnormalities.    Minna Antis, MD 06/01/18 [phone]

## 2018-06-01 NOTE — ED Notes (Signed)
Patient given medication with applesauce, its getting harder to encourage patient to take medications due to her refusal. Writer stayed in the room with patient and every time patient opened her mouth writer gave her medication with some water.

## 2018-06-01 NOTE — ED Notes (Addendum)
Writer and ED technician Ripp changed patients linen and depends, also applied Desitin cream to her perineal area. Patient is developing a small (about an inch) size sore on her left buttocks writer applied skin barrier cream to the area.   Writer provided mouth care to patient, patient allowed writer to brush her teeth and wipe her face and comb her hair with no issues.

## 2018-06-01 NOTE — ED Notes (Signed)
Patient repositioned herself from lying on right side to lying on left side.

## 2018-06-01 NOTE — ED Notes (Signed)
Pt cleaned and dried of urine. Very small amount. Pt has slept most of the shift. Has repositioned herself in the bed. Offered fluids but refused.

## 2018-06-01 NOTE — ED Notes (Signed)
Offered patient dinner grilled chicken with carrots and mashed potatoes, patient refused

## 2018-06-01 NOTE — Consult Note (Signed)
[facility] Face-to-Face Psychiatry Consult   Reason for Consult: Altered mental status Referring Physician: [facility] ED MD Patient Identification: Jeanette Walter MRN:  [id number] Principal Diagnosis: Dementia with behavioral disturbance (HCC) Diagnosis:  Principal Problem:   Dementia with behavioral disturbance (HCC)   Total Time spent with patient: 20 minutes  Subjective:  Patient is sleeping majority of shift.  She has missed multiple doses of medication due to excessive sedation.  Nursing reports that patient is more awake and alert at night, but does not have any behavioral issues in the evenings.   HPI: Jeanette Walter is a 73 y.o. female patient with advanced dementia presented to [facility] ED via EMS on 04/15/2018 for worsening dementia agitation and combativeness and was found trying to run away from her facility, and became agitated during COVID-19 quarantine rules at her facility.  She is less agitated when she can wander around--consistent with her dementia diagnosis. The patient has been changed to voluntarily status, with sister as her POA.   On evaluation, patient has been sleeping.  She is difficult to rouse, and does not make eye contact or answer questions. She has not had any behavioral issues in many days. She is intermittently compliant with medications, due to being asleep at times medications are ordered.  She has not refused medications while awake. Patient has been compliant with daily hygiene care and with physical therapy.  Past Psychiatric History: Dementia  Risk to Self:  No Risk to Others:  No Prior Inpatient Therapy:  Yes Prior Outpatient Therapy:  Yes  Past Medical History:  Past Medical History:  Diagnosis Date  . Dementia (HCC)    History reviewed. No pertinent surgical history. Family History: No family history on file.   Family Psychiatric  History: Patient's mother had a progressive dementia similar to what Evee is currently experiencing.  Social History:  Social  History   Substance and Sexual Activity  Alcohol Use Not on file     Social History   Substance and Sexual Activity  Drug Use Not on file    Social History   Socioeconomic History  . Marital status: Single    Spouse name: Not on file  . Number of children: Not on file  . Years of education: Not on file  . Highest education level: Not on file  Occupational History  . Not on file  Social Needs  . Financial resource strain: Not on file  . Food insecurity:    Worry: Not on file    Inability: Not on file  . Transportation needs:    Medical: Not on file    Non-medical: Not on file  Tobacco Use  . Smoking status: Never Smoker  . Smokeless tobacco: Never Used  Substance and Sexual Activity  . Alcohol use: Not on file  . Drug use: Not on file  . Sexual activity: Not on file  Lifestyle  . Physical activity:    Days per week: Not on file    Minutes per session: Not on file  . Stress: Not on file  Relationships  . Social connections:    Talks on phone: Not on file    Gets together: Not on file    Attends religious service: Not on file    Active member of club or organization: Not on file    Attends meetings of clubs or organizations: Not on file    Relationship status: Not on file  Other Topics Concern  . Not on file  Social History Narrative  .  Not on file   Additional Social History:  Patient is on able to return to independent living facility, as she needs higher level of care due to her level of dementia now requires memory care unit in which she is more able to ambulate freely.  Allowing patient to ambulate ad lib. causes significantly less agitation.  Allergies:   Allergies  Allergen Reactions  . Septra [Sulfamethoxazole-Trimethoprim]   . Sulfa Antibiotics     Labs:  Results for orders placed or performed during the hospital encounter of 04/15/18 (from the past 48 hour(s))  Basic metabolic panel     Status: Abnormal   Collection Time: 05/31/18  2:19 PM   Result Value Ref Range   Sodium 139 135 - 145 mmol/L   Potassium 3.2 (L) 3.5 - 5.1 mmol/L   Chloride 104 98 - 111 mmol/L   CO2 24 22 - 32 mmol/L   Glucose, Bld 89 70 - 99 mg/dL   BUN 11 8 - 23 mg/dL   Creatinine, Ser 1.61 0.44 - 1.00 mg/dL   Calcium 8.8 (L) 8.9 - 10.3 mg/dL   GFR calc non Af Amer >60 >60 mL/min   GFR calc Af Amer >60 >60 mL/min   Anion gap 11 5 - 15    Comment: Performed at [facility], [address] [place], [place] [address]  CBC with Differential     Status: Abnormal   Collection Time: 05/31/18  2:19 PM  Result Value Ref Range   WBC 7.3 4.0 - 10.5 K/uL   RBC 4.69 3.87 - 5.11 MIL/uL   Hemoglobin 13.7 12.0 - 15.0 g/dL   HCT 54.0 98.1 - 19.1 %   MCV 86.1 80.0 - 100.0 fL   MCH 29.2 26.0 - 34.0 pg   MCHC 33.9 30.0 - 36.0 g/dL   RDW 47.8 29.5 - 62.1 %   Platelets 172 150 - 400 K/uL   nRBC 0.0 0.0 - 0.2 %   Neutrophils Relative % 44 %   Neutro Abs 3.3 1.7 - 7.7 K/uL   Lymphocytes Relative 24 %   Lymphs Abs 1.7 0.7 - 4.0 K/uL   Monocytes Relative 13 %   Monocytes Absolute 0.9 0.1 - 1.0 K/uL   Eosinophils Relative 18 %   Eosinophils Absolute 1.3 (H) 0.0 - 0.5 K/uL   Basophils Relative 1 %   Basophils Absolute 0.1 0.0 - 0.1 K/uL   Immature Granulocytes 0 %   Abs Immature Granulocytes 0.02 0.00 - 0.07 K/uL    Comment: Performed at [facility], [address] [place], [place] [address]  Urinalysis, Complete w Microscopic     Status: Abnormal   Collection Time: 05/31/18  2:19 PM  Result Value Ref Range   Color, Urine AMBER (A) YELLOW    Comment: BIOCHEMICALS MAY BE AFFECTED BY COLOR   APPearance HAZY (A) CLEAR   Specific Gravity, Urine 1.027 1.005 - 1.030   pH 5.0 5.0 - 8.0   Glucose, UA NEGATIVE NEGATIVE mg/dL   Hgb urine dipstick NEGATIVE NEGATIVE   Bilirubin Urine NEGATIVE NEGATIVE   Ketones, ur 20 (A) NEGATIVE mg/dL   Protein, ur 30 (A) NEGATIVE mg/dL   Nitrite NEGATIVE NEGATIVE   Leukocytes,Ua NEGATIVE NEGATIVE   RBC / HPF  0-5 0 - 5 RBC/hpf   WBC, UA 0-5 0 - 5 WBC/hpf   Bacteria, UA NONE SEEN NONE SEEN   Squamous Epithelial / LPF 0-5 0 - 5   Mucus PRESENT     Comment:  Performed at [facility], [address] [place], [place] [address]    Current Facility-Administered Medications  Medication Dose Route Frequency Provider Last Rate Last Dose  . amLODipine (NORVASC) tablet 5 mg  5 mg Oral Daily Loleta RoseForbach, Cory, MD   5 mg at 05/30/18 1300  . aspirin chewable tablet 81 mg  81 mg Oral Daily Loleta RoseForbach, Cory, MD   81 mg at 05/30/18 1300  . atenolol (TENORMIN) tablet 50 mg  50 mg Oral Daily Loleta RoseForbach, Cory, MD   50 mg at 05/30/18 1300  . divalproex (DEPAKOTE SPRINKLE) capsule 750 mg  750 mg Oral QHS Mariel CraftMaurer, Sheila M, MD   750 mg at 05/30/18 2130  . donepezil (ARICEPT) tablet 5 mg  5 mg Oral QHS Loleta RoseForbach, Cory, MD   5 mg at 05/30/18 2130  . feeding supplement (ENSURE ENLIVE) (ENSURE ENLIVE) liquid 237 mL  237 mL Oral BID BM Charm RingsLord, Jamison Y, NP   237 mL at 05/30/18 1912  . fluticasone (FLONASE) 50 MCG/ACT nasal spray 1 spray  1 spray Each Nare Daily Loleta RoseForbach, Cory, MD   1 spray at 05/24/18 1114  . liver oil-zinc oxide (DESITIN) 40 % ointment   Topical BID Phineas SemenGoodman, Graydon, MD   1 application at 05/30/18 1300  . loratadine (CLARITIN) tablet 10 mg  10 mg Oral Daily Loleta RoseForbach, Cory, MD   10 mg at 05/30/18 1300  . LORazepam (ATIVAN) tablet 0.5 mg  0.5 mg Oral BID PRN Charm RingsLord, Jamison Y, NP   0.5 mg at 05/30/18 2130   Or  . LORazepam (ATIVAN) injection 0.5 mg  0.5 mg Intramuscular BID PRN Charm RingsLord, Jamison Y, NP   0.5 mg at 05/30/18 0233  . OLANZapine zydis (ZYPREXA) disintegrating tablet 10 mg  10 mg Oral QHS Mariel CraftMaurer, Sheila M, MD   10 mg at 05/30/18 2130  . OLANZapine zydis (ZYPREXA) disintegrating tablet 2.5 mg  2.5 mg Oral BID WC Mariel CraftMaurer, Sheila M, MD   Stopped at 05/31/18 1022  . sertraline (ZOLOFT) tablet 100 mg  100 mg Oral Daily Loleta RoseForbach, Cory, MD   100 mg at 05/30/18 1530  . simvastatin (ZOCOR) tablet 20 mg  20 mg Oral Daily  Loleta RoseForbach, Cory, MD   20 mg at 05/30/18 1530   Current Outpatient Medications  Medication Sig Dispense Refill  . donepezil (ARICEPT) 10 MG tablet Take 10 mg by mouth Nightly.    . QUEtiapine (SEROQUEL) 25 MG tablet Take three tablets ( 75 mg) at night for one week then decrease to two tablets ( 50 mg) nightly.    . QUEtiapine (SEROQUEL) 50 MG tablet Take one tablet nightly along with 25 mg nightly to equal 75 mg nightly.      Musculoskeletal: Strength & Muscle Tone: normal Gait & Station: good Patient leans: NA  Psychiatric Specialty Exam: Physical Exam  Nursing note and vitals reviewed. Constitutional: She appears well-developed. No distress.  HENT:  Head: Normocephalic and atraumatic.  Neck: Normal range of motion.  Cardiovascular: Normal rate and regular rhythm.  Respiratory: Effort normal. No respiratory distress.  Musculoskeletal: Normal range of motion.  Neurological: She is alert.  Psychiatric: She is slowed. Cognition and memory are impaired.    Review of Systems  Unable to perform ROS: Mental status change  Psychiatric/Behavioral: Positive for memory loss.    Blood pressure (!) 100/57, pulse 72, temperature 98.3 F (36.8 C), temperature source Oral, resp. rate 18, weight 65.3 kg, SpO2 96 %.Body mass index is 25.51 kg/m.  General Appearance: Fairly Groomed  Eye Contact:  none  Speech:  NA  Volume: NA  Mood:  Anxious at times, per nursing report  Affect:  blunted  Thought Process:  Coherent at times; sometimes nonsensical per report  Orientation:  Oriented to self, with prompts is aware of date and location.  Thought Content: Tangential, illogical per report  Suicidal Thoughts:  No  Homicidal Thoughts:  No  Memory:  Immediate, poor; recent, fair; remote, poor  Judgement:  Impaired  Insight:  Lacking   Psychomotor Activity:  Decreased   Concentration:  Concentration: Poor along with attention  Recall:  Poor   Fund of Knowledge:  Fair  Language:  Fair   Akathisia:  NA  Handed:  Right  AIMS (if indicated):     Assets:  Desire for Improvement Housing Social Support  ADL's:  Impaired  Cognition:  moderate  Sleep:   Fluctuates     Treatment Plan Summary: Daily contact with patient to assess and evaluate symptoms and progress in treatment and Medication management  Dementia with behavioral changes: - Discontinue Depakote Sprinkle (Depakote level was 43, not therapeutic on 04/25/2018) - Depakote level 23 on 05/23/2018 due to patient missing doses due to being asleep at time for administration.  Will discontinue.  - Change to Zyprexa 2.5 mg TID PRN for aggressive/agitated behaviors. - Continue Aricept 5 mg daily for dementia, but likely will be able to discontinue.  Agitation: -Continued Ativan 0.5 mg BID PRN   Depression  -Continue Zoloft 100 mg PO daily, will consider weaning off to avoid polypharmacy.  Nutritional support  -Continue Chocolate Ensure BID  Seasonal allergies - change Flonase and Claritin to PRN  Disposition: Psychiatrically discharged, social work placement into a Memory Care Center Her dementia appears to be progressing, psychiatrically stable to transfer to a [facility].  She has not required any PRN medications.  Will attempt to wean back on some medications and re-orient patient to day/night.  Mariel Craft, MD 06/01/2018 10:46 AM

## 2018-06-01 NOTE — ED Notes (Signed)
Report to include situation, background, assessment and recommendations from [facility]. Patient sleeping, respirations regular and unlabored. Q15 minute rounds and Security, rover observation to continue.

## 2018-06-01 NOTE — ED Notes (Signed)
Writer attempted to call patients sister Myra to allow patient to listen to her sisters voice, sister did not answer. Left a HIPAA compliant voicemail

## 2018-06-02 NOTE — ED Notes (Signed)
Pt's brief checked.  Brief dry.

## 2018-06-02 NOTE — ED Notes (Signed)
Assisted patient out of bed to the recliner while linen was changed. Pt sat in recliner awhile. I attempted to try and feed pt food and ensure. She drank 8 ounce cup of water and drank a little of ensure. Pt refused food tray. AS

## 2018-06-02 NOTE — ED Notes (Signed)
BEHAVIORAL HEALTH ROUNDING Patient sleeping: Yes.   Patient alert and oriented: eyes closed  Appears asleep Behavior appropriate: Yes.  ; If no, describe:  Nutrition and fluids offered: Yes  Toileting and hygiene offered: sleeping Sitter present: q 15 minute observations and security monitoring Law enforcement present: yes  ODS 

## 2018-06-02 NOTE — ED Notes (Signed)

## 2018-06-02 NOTE — ED Notes (Signed)
Patient observed lying in bed with eyes closed  Even, unlabored respirations observed   NAD pt appears to be sleeping  I will continue to monitor along with every 15 minute visual observations and ongoing security monitoring    

## 2018-06-02 NOTE — ED Notes (Signed)
Pt sleeping. Lunch tray placed at bedside. 

## 2018-06-02 NOTE — ED Notes (Signed)
Hourly rounding reveals patient sleeping in room. No complaints, stable, in no acute distress. Q15 minute rounds and monitoring via Rover and Officer to continue.  

## 2018-06-02 NOTE — ED Notes (Signed)
Report to include Situation, Background, Assessment, and Recommendations received from Rhea RN. Patient sleeping, in no acute distress. Q15 minute rounds and Rover and Officer presence for their safety.   

## 2018-06-02 NOTE — ED Notes (Signed)
Pts brief changed  

## 2018-06-02 NOTE — ED Notes (Signed)
BEHAVIORAL HEALTH ROUNDING Patient sleeping: Yes.   Patient alert and oriented: eyes closed  Appears to be asleep Behavior appropriate: Yes.  ; If no, describe:  Nutrition and fluids offered: Yes  Toileting and hygiene offered: sleeping Sitter present: q 15 minute observations and security monitoring Law enforcement present: yes  ODS 

## 2018-06-02 NOTE — TOC Progression Note (Signed)
Transition of Care [facility]) - Progression Note    Patient Details  Name: Jeanette Walter MRN: [id number] Date of Birth: 02-27-1945  Transition of Care [facility]) CM/SW Contact  Tania Aoki Wedemeyer, LCSW Phone Number: 06/02/2018, 9:24 AM  Clinical Narrative:    CSW attempted to contact Juliette Mangle at DSS to find out status of medicaid application for this pt. Voicemail was left.   Expected Discharge Plan: Memory Care Barriers to Discharge: Insurance Authorization, Other (comment)(Medicaid pending)  Expected Discharge Plan and Services Expected Discharge Plan: Memory Care In-house Referral: Clinical Social Work Discharge Planning Services: CM Consult   Living arrangements for the past 2 months: Assisted Living Facility([facility])                                       Social Determinants of Health (SDOH) Interventions    Readmission Risk Interventions No flowsheet data found.

## 2018-06-02 NOTE — ED Provider Notes (Signed)
-----------------------------------------   6:23 AM on 06/02/2018 -----------------------------------------   Blood pressure 107/74, pulse 84, temperature 97.6 F (36.4 C), temperature source Axillary, resp. rate 18, weight 65.3 kg, SpO2 97 %.  The patient is calm and cooperative at this time.  There have been no acute events since the last update.  Awaiting disposition plan from clinical social work team.   Irean Hong, MD 06/02/18 [phone]

## 2018-06-03 NOTE — ED Notes (Signed)
Pt voided and was changed.

## 2018-06-03 NOTE — ED Notes (Signed)
Hourly rounding reveals patient in room. No complaints, stable, in no acute distress. Q15 minute rounds and monitoring via Rover and Officer to continue.   

## 2018-06-03 NOTE — ED Provider Notes (Signed)
-----------------------------------------   6:57 AM on 06/03/2018 -----------------------------------------   Blood pressure (!) 116/55, pulse 75, temperature 97.9 F (36.6 C), temperature source Oral, resp. rate 16, weight 65.3 kg, SpO2 94 %.  The patient is calm and cooperative at this time.  There have been no acute events since the last update.  Awaiting disposition plan from Behavioral Medicine team and/or social work.  Patient is now in her 49th day in the ED.   Loleta Rose, MD 06/03/18 [phone]

## 2018-06-03 NOTE — ED Notes (Signed)
BEHAVIORAL HEALTH ROUNDING Patient sleeping: Yes.   Patient alert and oriented: eyes closed  Appears to be asleep Behavior appropriate: Yes.  ; If no, describe:  Nutrition and fluids offered: Yes  Toileting and hygiene offered: sleeping Sitter present: q 15 minute observations and security monitoring Law enforcement present: yes  ODS 

## 2018-06-03 NOTE — ED Notes (Signed)
Hourly rounding reveals patient sleeping in room. No complaints, stable, in no acute distress. Q15 minute rounds and monitoring via Rover and Officer to continue.  

## 2018-06-03 NOTE — TOC Progression Note (Signed)
Transition of Care [facility]) - Progression Note    Patient Details  Name: Jeanette Walter MRN: [id number] Date of Birth: [birth year]  Transition of Care [facility]) CM/SW Contact  Darleene Cleaver, [place] Phone Number: 06/03/2018, 3:12 PM  Clinical Narrative:     CSW called [facility], and [place] to see if they accept Medicaid they said they do not.  CSW was referred to contact [facility], [phone], left a message, also contacted Crystal at [facility] [phone], she asked to have clinical information sent to her.  CSW faxed clinical information to [phone].  CSW awaiting response back from ALFs, patient's special assistance Medicaid application number is [id number].  CSW continuing to follow patient's progress throughout discharge planning.   Expected Discharge Plan: Memory Care Barriers to Discharge: Insurance Authorization, Other (comment)(Medicaid pending)  Expected Discharge Plan and Services Expected Discharge Plan: Memory Care In-house Referral: Clinical Social Work Discharge Planning Services: CM Consult   Living arrangements for the past 2 months: Assisted Living Facility([facility])                      Social Determinants of Health (SDOH) Interventions    Readmission Risk Interventions No flowsheet data found.

## 2018-06-03 NOTE — ED Notes (Signed)
Pt finished one whole ensure at 7pm.

## 2018-06-03 NOTE — ED Notes (Signed)
Pt asleep, breakfast tray placed in chair beside bed.  

## 2018-06-03 NOTE — ED Notes (Signed)
Pt woke up at 6:30 pm and took one bite of banana. Pt was moved from the bed to her recliner.

## 2018-06-03 NOTE — ED Notes (Signed)
VOL  PENDING  PLACEMENT 

## 2018-06-03 NOTE — ED Notes (Signed)
Patient observed lying in bed with eyes closed  Even, unlabored respirations observed   NAD pt appears to be sleeping  I will continue to monitor along with every 15 minute visual observations and ongoing security monitoring    

## 2018-06-03 NOTE — ED Notes (Signed)

## 2018-06-03 NOTE — ED Notes (Signed)
Report to include Situation, Background, Assessment, and Recommendations received from Amy B. RN. Patient alert, warm and dry, in no acute distress. Patient refuses to answer questions related to SI, HI, AVH and pain. Patient made aware of Q15 minute rounds and Psychologist, counselling presence for their safety. Patient instructed to come to me with needs or concerns.

## 2018-06-03 NOTE — TOC Progression Note (Addendum)
Transition of Care [facility]) - Progression Note    Patient Details  Name: Nature Millet MRN: [id number] Date of Birth: 1945-10-11  [facility]) CM/SW Contact  Tania Paquita Printy, LCSW Phone Number: 06/03/2018, 10:20 AM  Clinical Narrative:    9:10am -  Pt's DSS worker, Gus Puma, contacted to CSW to ask about any updates regarding placement. CSW informed her that Basilia Jumbo is waiting for special assistance medicaid number, and that we have tried to contact Juliette Mangle for this number. Jon Gills stated that she will see if she can find the number and will call CSW back.   1:13pm - Pt's DSS worker Gus Puma stated that she was unable to find the medicaid pending number for this pt. She read in the notes that the application is still in pending status.   1:37pm CSW attempted to contact Ovidio Hanger at [facility] to determine whether she would be able to accept pt although she is still in pending status for special assistance medicaid. Voicemail left.   2:04pm CSW contacted [facility] DSS for resources for memory care facilities. CSW calling facilities to learn who accepts special assistance medicaid.  CSW also attempted to contact someone at [facility] DSS. Voicemail unavailable.   2:29pm - Pt's private case manager, Tammy, contacted CSW to ask for any updates. Tammy asked for contact information for Juliette Mangle at DSS and Ovidio Hanger at [facility] in order to follow-up.     Expected Discharge Plan: Memory Care Barriers to Discharge: Insurance Authorization, Other (comment)(Medicaid pending)  Expected Discharge Plan and Services Expected Discharge Plan: Memory Care In-house Referral: Clinical Social Work Discharge Planning Services: CM Consult   Living arrangements for the past 2 months: Assisted Living Facility([facility])                                       Social Determinants of Health (SDOH) Interventions    Readmission Risk Interventions No  flowsheet data found.

## 2018-06-04 NOTE — ED Notes (Signed)
Hourly rounding reveals patient sleeping in room. No complaints, stable, in no acute distress. Q15 minute rounds and monitoring via Rover and Officer to continue.  

## 2018-06-04 NOTE — ED Notes (Signed)
VOL/ CSW placement pending

## 2018-06-04 NOTE — TOC Progression Note (Addendum)
Transition of Care [facility]) - Progression Note    Patient Details  Name: Jeanette Walter MRN: [id number] Date of Birth: October 30, 1945  Transition of Care [facility]) CM/SW Contact  Tania Deklan Minar, LCSW Phone Number: 06/04/2018, 4:00 PM  Clinical Narrative:    3:46 pm - CSW attempted to contact Juliette Mangle at DSS to determine if there was a caseworker assigned to pt for special assistance Medicaid. Voicemail left.   3:50pm - CSW called Cabin crew at [facility]. Danita shared that she is able to accept pt while she is in pending status. Danita stated that they have to do a visual assessment to determine final decision. Assessment is set up for 2:00pm tomorrow 06/05/2018 via Facetime [phone])  4:04pm - Juliette Mangle from DSS shared that the patient's caseworker's name is Oren Beckmann, and that pt is still in pending status for special assistance medicaid.   4:20pm CSW left a voicemail for pt's sister [name] Shipp to let her know about visual assessment for pt with [facility].  CSW also updated pt's private case Production designer, theatre/television/film.   5:13pm - Pt's sister [name] Shipp returned call to CSW. Myra was informed about pt's visual assessment tomorrow with [facility]. Myra stated that she can be called for additional information if [facility] needs it.   Expected Discharge Plan: Memory Care Barriers to Discharge: Insurance Authorization, Other (comment)(Medicaid pending)  Expected Discharge Plan and Services Expected Discharge Plan: Memory Care In-house Referral: Clinical Social Work Discharge Planning Services: CM Consult   Living arrangements for the past 2 months: Assisted Living Facility([facility])                                       Social Determinants of Health (SDOH) Interventions    Readmission Risk Interventions No flowsheet data found.

## 2018-06-04 NOTE — ED Notes (Signed)
Hourly rounding reveals patient in room with Tech. No complaints, stable, in no acute distress. Q15 minute rounds and monitoring via Psychologist, counselling to continue.

## 2018-06-04 NOTE — ED Notes (Signed)
Hourly rounding reveals patient sleeping in room. No complaints, stable, in no acute distress. Q15 minute rounds and monitoring via Security to continue. 

## 2018-06-04 NOTE — ED Notes (Addendum)
Writer and [employer], RN went in patients room and attempted to feed her lunch, which was meatloaf and mashed potatoes. Patient pleasantly refused to eat, but was verbal with staff disorganized talking. Writer attempted to give patient medication in applesauce and patient refused to take it, will keep attempting. It is harder to encourage patient to take medication and eat her meals.  Writer and nurse changed patients Depends and wiped her up. Patient kept saying "Im tired" and as soon as staff finished patient pulled her covers up and rolled over and went to sleep.

## 2018-06-04 NOTE — ED Notes (Signed)
Pt assisted back to bed from recliner.  Placed on bedside commode.

## 2018-06-04 NOTE — ED Notes (Signed)
Pt ambulated to bathroom with walker with assistance from this tech. Pt urinated, preformed hand hygiene and back to rm to sit in recliner. Pt eating meal at this time. Will continue to monitor.

## 2018-06-04 NOTE — ED Notes (Addendum)
Pt up to bedside commode, urinated in bedside commode.  Assisted back to bed by staff.

## 2018-06-04 NOTE — ED Notes (Signed)
Patient repositioned herself from her back to her right side. Patient has been restless for the past hour and moving throughout the bed. NAD noted. Patient observed lying in bed with eyes closed  Even, unlabored respirations observed. Patient being monitored q 15 min by staff

## 2018-06-04 NOTE — ED Provider Notes (Signed)
-----------------------------------------   12:51 PM on 06/04/2018 -----------------------------------------   BP 119/68 (BP Location: Left Arm)   Pulse 68   Temp (!) 97.5 F (36.4 C) (Oral)   Resp 16   Wt 65.3 kg   SpO2 96%   BMI 25.51 kg/m   No acute events overnight. Vitals reviewed. Patient remains medically cleared.  Disposition is pending per Psychiatry/Behavioral Medicine team recommendations.    Jene Every, MD 06/04/18 1251

## 2018-06-04 NOTE — ED Notes (Signed)
Informed charge nurse Tammy Sours and MD Cyril Loosen that patient has refused morning medications. Charge nurse recommended a speech consult

## 2018-06-04 NOTE — ED Notes (Signed)
Helped pt to bedside commode with help of [employer]

## 2018-06-04 NOTE — ED Notes (Signed)
Hourly rounding reveals patient in room. No complaints, stable, in no acute distress. Q15 minute rounds and monitoring via Rover and Officer to continue.   

## 2018-06-04 NOTE — ED Notes (Signed)
Offered pt meal tray, pt declined.

## 2018-06-04 NOTE — ED Notes (Addendum)
Patient responding to internal stimuli.

## 2018-06-04 NOTE — ED Notes (Signed)
Pt back to bed at this time

## 2018-06-05 NOTE — ED Notes (Signed)
Pt found in room kneeling next to be with head and arms on bed, saying it hurts. Pt was then walked to the bathroom by this tech and [employer]. This tech came back to check in pt, and pt had vomited on the floor. RN Toniann Fail notified. Pt then escorted back to her room by this tech. This tech changed pt's clothing and bedding, and assisted pt to chair in the room. She was left sitting watching television.

## 2018-06-05 NOTE — ED Notes (Signed)
Patient observed lying in bed with eyes closed  Even, unlabored respirations observed   NAD pt appears to be sleeping  I will continue to monitor along with every 15 minute visual observations and ongoing security monitoring    

## 2018-06-05 NOTE — ED Notes (Signed)
BEHAVIORAL HEALTH ROUNDING Patient sleeping: No. Patient alert and oriented: yes Behavior appropriate: Yes.  ; If no, describe:  Nutrition and fluids offered: yes Toileting and hygiene offered: Yes  Sitter present: q15 minute observations and security  monitoring Law enforcement present: Yes  ODS  

## 2018-06-05 NOTE — ED Notes (Signed)
Patient ambulating in the hallway, she is redirectable, encouraged her to go back to her room and she did, Nurse tech got her to go back to her bed, Patient is safe, will continue to monitor.

## 2018-06-05 NOTE — ED Provider Notes (Signed)
-----------------------------------------   12:59 PM on 06/05/2018 -----------------------------------------   BP (!) 103/50 (BP Location: Left Arm)   Pulse 73   Temp 98.2 F (36.8 C) (Oral)   Resp 16   Wt 65.3 kg   SpO2 93%   BMI 25.51 kg/m   No acute events since last update.  Disposition is pending per Psychiatry/Behavioral Medicine team recommendations.     Phineas Semen, MD 06/05/18 1259

## 2018-06-05 NOTE — ED Notes (Signed)
Patient dry at this time 

## 2018-06-05 NOTE — TOC Progression Note (Addendum)
Transition of Care [facility]) - Progression Note    Patient Details  Name: Jeanette Walter MRN: [id number] Date of Birth: 19-Mar-1945  Transition of Care [facility]) CM/SW Contact  Tania Satcha Storlie, LCSW Phone Number: 06/05/2018, 2:12 PM  Clinical Narrative:    2:00pm- CSW set up facetime call for Jeanette Walter at [facility] to complete visual assessment for pt. Pt's nurse (Amy) was present as well to answer questions regarding pt. Danita stated that she received the information she needs, and will move forward. Danita shared she will let CSW know of final decision within an hour.   4:14pm - CSW attempted to contact Jeanette Walter to follow-up. She did not pick up.    Expected Discharge Plan: Memory Care Barriers to Discharge: Insurance Authorization, Other (comment)(Medicaid pending)  Expected Discharge Plan and Services Expected Discharge Plan: Memory Care In-house Referral: Clinical Social Work Discharge Planning Services: CM Consult   Living arrangements for the past 2 months: Assisted Living Facility([facility])                                       Social Determinants of Health (SDOH) Interventions    Readmission Risk Interventions No flowsheet data found.

## 2018-06-05 NOTE — ED Notes (Addendum)
This Clinical research associate and nurse Amy T. Checked patient dry at this time. And patient requested water, water was given
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
## 2018-06-05 NOTE — ED Notes (Signed)
Patient is calm and cooperative at this time, Patient with speech therapy eval at this time.

## 2018-06-05 NOTE — ED Notes (Signed)
Attempted to feed pt a tray, pt ate 4 bites of apple sauce and drank half of one cup of cranberry juice.

## 2018-06-05 NOTE — ED Notes (Signed)

## 2018-06-05 NOTE — ED Notes (Signed)
BEHAVIORAL HEALTH ROUNDING Patient sleeping: Yes.   Patient alert and oriented: eyes closed  Appears asleep Behavior appropriate: Yes.  ; If no, describe:  Nutrition and fluids offered: Yes  Toileting and hygiene offered: sleeping Sitter present: q 15 minute observations and security monitoring Law enforcement present: yes  ODS 

## 2018-06-05 NOTE — Evaluation (Signed)
Clinical/Bedside Swallow Evaluation Patient Details  Name: Jeanette Walter MRN: [id number] Date of Birth: 1945/08/07  Today's Date: 06/05/2018 Time: SLP Start Time (ACUTE ONLY): 1510 SLP Stop Time (ACUTE ONLY): 1550 SLP Time Calculation (min) (ACUTE ONLY): 40 min  Past Medical History:  Past Medical History:  Diagnosis Date  . Dementia [facility])    Past Surgical History: History reviewed. No pertinent surgical history. HPI:  Pt is a 72 y.o. female with a history of Dementia who presented to the ED on 04/14/2018 for aggressive and combative behavior.  She was brought from [facility] independent living with involuntary commitment paperwork after being found trying to run away from her facility.  She has a history of Dementia and HTN. Pt has been awaiting disposition from Behavioral Medicine and SW.  Pt is in her ~51st day of stay at the ED. MD notes are not indicating any acute issues; pt is remaining calmer in her behaviors. Pt appears to sleep often during the day. She has been noted to eat "some" at meals; an Ensure intermittently noted. Per NSG notes, it has been "harder to encourage patient to take medication and eat her meals" in recent days. There are no reports of coughing/choking or dysphagia per chart ED notes. She often refuses po's or meals.   Assessment / Plan / Recommendation Clinical Impression  Pt appeared to present w/ adequate oropharyngeal phase swallowing function w/ no immediate, overt s/s of aspiration noted w/ the po trials given at this brief evaluation; aspiration precautions were followed. Pt tended to refuse offers of po's despite encouragement;  but when holding the Cup w/ SLP, she took long draws on the straw when drinking thin liquids(juice). Pt did not immediately make effort to feed herself and was completely distracted by the task/activity so SLP attempted visual/tactile cues to engage pt in feeding herself. Pt consumed po trials of thin and Nectar liquids including a puree  but would not accept the solid foods trial(graham cracker). Again, no overt s/s of aspiration noted; no coughing, no decline in respiratory presentation during/post trials. Attempted education on basic aspiration precautions including drinking slowly. Oral phase c/b adequate bolus management of the puree trial and the liquids; oral clearing adequate b/t bite/sips. Dentition appeared adequate. OM exam revealed No unilateral weakness in lingual/labial movements during bolus management and drinking from straw; nor at rest. Pt required full feeding assistance d/t Cognitive decline baseline. She was easily distracted. Recommend continue a regular diet w/ Cut meats and thin liquids; maybe some finger foods such as chicken strips; general aspiration precautions; feeding assistance at all meals - upright position during/post meals; decrease distractions during meals and engage her help w/ holding cup/foods. Support and monitoring at all meals d/t baseline Dementia. Pt appears at her baseline status since admission(reports of tolerating po's then); NSG to reconsult if any further needs during admission.  SLP Visit Diagnosis: Dysphagia, unspecified (R13.10)(impacted by Cognitive decline/status baseline)    Aspiration Risk  Mild aspiration risk(reduced w/ precautions and monitoring during meals)    Diet Recommendation  Regular diet w/ Cut meats, gravy; maybe some finger foods such as chicken strips. Thin liquids. General aspiration precautions. Nutritional support(drinks may be best form)  Medication Administration: Whole meds with liquid(or w/ Puree for safer swallowing if needed)    Other  Recommendations Recommended Consults: (Dietician f/u) Oral Care Recommendations: Oral care BID;Staff/trained caregiver to provide oral care Other Recommendations: (n/a)   Follow up Recommendations None      Frequency and Duration (n/a)  (  n/a)       Prognosis Prognosis for Safe Diet Advancement: Fair Barriers to Reach  Goals: Cognitive deficits;Time post onset;Severity of deficits;Behavior      Swallow Study   General Date of Onset: 04/15/18 HPI: Pt is a 73 y.o. female with a history of Dementia who presented to the ED on 04/14/2018 for aggressive and combative behavior.  [facility] with involuntary commitment paperwork after being found trying to run away from her facility.  She has a history of Dementia and HTN. Pt has been awaiting disposition from Behavioral Medicine and SW.  Pt is in her ~51st day of stay at the ED. MD notes are not indicating any acute issues; pt is remaining calmer in her behaviors. Pt appears to sleep often during the day. She has been noted to eat "some" at meals; an Ensure intermittently noted. Per NSG notes, it has been "harder to encourage patient to take medication and eat her meals" in recent days. There are no reports of coughing/choking or dysphagia per chart ED notes. She often refuses po's or meals. Type of Study: Bedside Swallow Evaluation Previous Swallow Assessment: none reported Diet Prior to this Study: Regular;Thin liquids Temperature Spikes Noted: No(wbc 7.3) Respiratory Status: Room air History of Recent Intubation: No Behavior/Cognition: Alert;Pleasant mood;Confused;Distractible;Requires cueing;Doesn't follow directions Oral Cavity Assessment: (could not assess d/t lack of cooperation) Oral Care Completed by SLP: No Oral Cavity - Dentition: Adequate natural dentition(noted anterior dentition) Vision: Functional for self-feeding(but unable to focus consistently) Self-Feeding Abilities: Able to feed self;Needs assist;Needs set up;Total assist(unable to focus consistently) Patient Positioning: Upright in bed(needed positioning) Baseline Vocal Quality: Low vocal intensity(mumbled speech at times) Volitional Cough: Cognitively unable to elicit Volitional Swallow: Unable to elicit    Oral/Motor/Sensory Function Overall Oral  Motor/Sensory Function: Within functional limits(apparent during bolus management; at rest)   Ice Chips Ice chips: Not tested   Thin Liquid Thin Liquid: Within functional limits Presentation: Self Fed;Straw(assisted; 2+ ozs of apple juice) Other Comments: distracted in follow through w/ task    Nectar Thick Nectar Thick Liquid: Within functional limits Presentation: Self Fed;Straw(assisted; 3 swallows apparent) Other Comments: distracted w/ task   Honey Thick Honey Thick Liquid: Not tested   Puree Puree: Within functional limits Presentation: Spoon(fed; 1 trial) Other Comments: would not accept further   Solid     Solid: Not tested Other Comments: would not accept trial       Jerilynn SomKatherine Clance Baquero, MS, CCC-SLP [place] 06/05/2018,4:37 PM

## 2018-06-05 NOTE — ED Notes (Signed)
BEHAVIORAL HEALTH ROUNDING Patient sleeping: Yes.   Patient alert and oriented: eyes closed  Appears to be asleep Behavior appropriate: Yes.  ; If no, describe:  Nutrition and fluids offered: Yes  Toileting and hygiene offered: sleeping Sitter present: q 15 minute observations and security monitoring Law enforcement present: yes  ODS 

## 2018-06-06 ENCOUNTER — Emergency Department: Payer: Medicare Other

## 2018-06-06 LAB — SARS CORONAVIRUS 2 BY RT PCR (HOSPITAL ORDER, PERFORMED IN ~~LOC~~ HOSPITAL LAB): SARS Coronavirus 2: NEGATIVE

## 2018-06-06 NOTE — ED Provider Notes (Signed)
-----------------------------------------   6:55 AM on 06/06/2018 -----------------------------------------   Blood pressure 105/72, pulse 63, temperature 98 F (36.7 C), temperature source Oral, resp. rate 16, weight 65.3 kg, SpO2 97 %.  The patient is sleeping at this time.  There have been no acute events since the last update.  Awaiting disposition plan from social work/behavioral Medicine team.   Irean Hong, MD 06/06/18 [phone]

## 2018-06-06 NOTE — ED Notes (Signed)
Pt's depend changed and patient cleaned.  Pt agitated and grabbing staff. Maintained on 15 minute checks and observation by security for safety.

## 2018-06-06 NOTE — TOC Progression Note (Addendum)
Transition of Care [facility]) - Progression Note    Patient Details  Name: Jeanette Walter MRN: [id number] Date of Birth: 1945/08/31  Transition of Care [facility]) CM/SW Contact  Jeanette Walter, [place] Phone Number: 06/06/2018, 1:37 PM  Clinical Narrative:     CSW received a phone call from Jeanette Walter that patient has been accepted by [facility] memory care [facility] and they can accept her on Monday.  Jeanette Walter is requesting updated clinical information, CSW will be faxed to ALF once fax number has been received.  2:15pm  CSW received phone call back from Jeanette Walter at [facility] memory care [facility].  She is requesting that clinical information, Chest x-ray to rule out TB, and Covid 19 test results to be faxed to [phone].  She will need the FL2 to be updated with discharge medications on Monday and faxed to her.  She is requesting that patient be discharging late afternoon around 2pm or 3pm in order to make arrangements for patient to go to memory care ALF.  CSW contacted patient's sister Jeanette Walter, [phone] and updated her that [facility] will accept patient on Monday.  CSW informed her that the facility will need paperwork to be completed and the ALF will contact her to make arrangements to complete the paperwork.  CSW to continue to follow patient's progress throughout discharge planning.  5:00pm  CSW faxed chest x-ray and new covid results to Jeanette Walter at [phone].    Expected Discharge Plan: Memory Care Barriers to Discharge: Insurance Authorization, Other (comment)(Medicaid pending)  Expected Discharge Plan and Services Expected Discharge Plan: Memory Care In-house Referral: Clinical Social Work Discharge Planning Services: CM Consult   Living arrangements for the past 2 months: Assisted Living Facility([facility])        Social Determinants of Health (SDOH) Interventions    Readmission Risk Interventions No flowsheet data found.

## 2018-06-06 NOTE — ED Notes (Signed)
Patient was dried and clean brief placed back on.

## 2018-06-06 NOTE — ED Notes (Signed)
Patient dry at this time 

## 2018-06-06 NOTE — ED Notes (Signed)
Pt attempting to get out of bed. Assisted patient to walk around her room and to recliner where patient wanted to sit.

## 2018-06-06 NOTE — ED Notes (Addendum)
RN found patient with knees on floor in between the bed rails. Tammy Sours, RN assisted this writer to get patient back to bed.

## 2018-06-06 NOTE — ED Notes (Signed)
Hourly rounding reveals patient sleeping in room. No complaints, stable, in no acute distress. Q15 minute rounds and monitoring via Security to continue. 

## 2018-06-06 NOTE — ED Notes (Signed)
Pt given Ensure and dinner tray.
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
## 2018-06-06 NOTE — ED Notes (Signed)
Care assumed

## 2018-06-07 NOTE — ED Notes (Signed)
Pt's brief changed and destin ointment applied.

## 2018-06-07 NOTE — ED Notes (Signed)
Pt drank cup of ice water 

## 2018-06-07 NOTE — ED Notes (Signed)
Pt up to toilet with 2 person assist and walker.  Urinated in toilet.  Pt took 4 bites of grilled cheese sandwich from dinner tray and drank half of an Ensure.

## 2018-06-07 NOTE — ED Notes (Signed)
Pt's brief checked.  Brief was dry. 

## 2018-06-07 NOTE — ED Notes (Signed)
Pt drank a couple sips of Ensure.

## 2018-06-07 NOTE — ED Notes (Signed)
Pt's brief checked.  Brief was dry.

## 2018-06-07 NOTE — ED Notes (Signed)
Pt ambulated to bathroom with nurse. Pt brushed her teeth.

## 2018-06-07 NOTE — ED Notes (Signed)
Pt in recliner watching TV. Maintained on 15 minute checks and observation by security for safety.

## 2018-06-07 NOTE — ED Notes (Signed)
Pt given lunch tray.  Pt drank some Ensure and had a little applesauce with her medications.

## 2018-06-07 NOTE — ED Notes (Signed)
VOL  PENDING  PLACEMENT 

## 2018-06-08 NOTE — ED Notes (Signed)
BEHAVIORAL HEALTH ROUNDING Patient sleeping: No. Patient alert : yes Behavior appropriate: Yes.  ; If no, describe:  Nutrition and fluids offered: yes Toileting and hygiene offered: Yes  Sitter present: q15 minute observations and security monitoring Law enforcement present: Yes  ODS  

## 2018-06-08 NOTE — ED Notes (Signed)

## 2018-06-08 NOTE — ED Notes (Signed)
ED  Is the patient under IVC or is there intent for IVC:  Voluntary  - she is awaiting placement on memory care unit  Is the patient medically cleared:  Is there vacancy in the ED BHU:  Unit closed    Is the population mix appropriate for patient:    Is the patient awaiting placement in inpatient or outpatient setting: Yes.   Has the patient had a psychiatric consult: Yes.   Survey of unit performed for contraband, proper placement and condition of furniture, tampering with fixtures in bathroom, shower, and each patient room: Yes.  ; Findings:  APPEARANCE/BEHAVIOR Calm and cooperative NEURO ASSESSMENT Orientation: oriented to herself  denies pain Hallucinations: No.None noted (Hallucinations) denies  Speech: Normal  Slow to respond  Gait: normal  - walker is at her bedside  - encouraged her to use the walker when OOB RESPIRATORY ASSESSMENT Even  Unlabored respirations  CARDIOVASCULAR ASSESSMENT Pulses equal   regular rate  Skin warm and dry   GASTROINTESTINAL ASSESSMENT no GI complaint EXTREMITIES Full ROM  PLAN OF CARE Provide calm/safe environment. Vital signs assessed twice daily. ED BHU Assessment once each 12-hour shift. Collaborate with TTS if available  or as condition indicates. Assure the ED provider has rounded once each shift. Provide and encourage hygiene. Provide redirection as needed. Assess for escalating behavior; address immediately and inform ED provider.  Assess family dynamic and appropriateness for visitation as needed: Yes.  ; If necessary, describe findings:  Educate the patient/family about BHU procedures/visitation: Yes.  ; If necessary, describe findings:

## 2018-06-08 NOTE — ED Notes (Signed)
Report to include Situation, Background, Assessment, and Recommendations received from [facility]. Patient alert, warm and dry, in no acute distress. Patient refuses to answer questions related to SI, HI, AVH and pain. Patient made aware of Q15 minute rounds and Psychologist, counselling presence for their safety. Patient instructed to come to me with needs or concerns.

## 2018-06-08 NOTE — ED Notes (Signed)
Hourly rounding reveals patient in bed. No complaints, stable, in no acute distress. Q15 minute rounds and monitoring via Psychologist, counselling to continue.

## 2018-06-08 NOTE — ED Notes (Signed)
BEHAVIORAL HEALTH ROUNDING Patient sleeping: Yes.   Patient alert and oriented: eyes closed  Appears to be asleep Behavior appropriate: Yes.  ; If no, describe:  Nutrition and fluids offered: Yes  Toileting and hygiene offered: sleeping Sitter present: q 15 minute observations and security monitoring Law enforcement present: yes  ODS 

## 2018-06-08 NOTE — ED Notes (Signed)
Patient observed lying in bed with eyes closed  Even, unlabored respirations observed   NAD pt appears to be sleeping  I will continue to monitor along with every 15 minute visual observations and ongoing security monitoring    

## 2018-06-08 NOTE — ED Notes (Addendum)
She awakened when the tech offered her lunch - she drank half of her ensure and ate a few bites of her lunch  VSS  She states to me as I am taking her VS   "I am full  - I don't want anymore food right now"  VSS   Brief changed

## 2018-06-08 NOTE — ED Notes (Signed)
Hourly rounding reveals patient sleeping in room with Rover at door. No complaints, stable, in no acute distress. Q15 minute rounds and monitoring via Psychologist, counselling to continue.

## 2018-06-08 NOTE — ED Provider Notes (Signed)
-----------------------------------------   7:10 AM on 06/08/2018 -----------------------------------------   Blood pressure (!) 110/56, pulse 72, temperature 98 F (36.7 C), temperature source Axillary, resp. rate 16, weight 65.3 kg, SpO2 95 %.  The patient is calm and cooperative at this time.  There have been no acute events since the last update.  Awaiting disposition plan from social work team.   Dionne Bucy, MD 06/08/18 [phone]

## 2018-06-08 NOTE — ED Notes (Signed)
Hourly rounding reveals patient sitting in chair in room.  No complaints, stable, in no acute distress. Q15 minute rounds and monitoring via Rover and Officer to continue. 

## 2018-06-08 NOTE — ED Notes (Signed)
Hourly rounding reveals patient in room with Rover at door. No complaints, stable, in no acute distress. Q15 minute rounds and monitoring via Psychologist, counselling to continue.

## 2018-06-09 NOTE — ED Notes (Signed)
Patient was sitting in her chair, when writer came in to check on patient she got up from chair and asked writer [place], she then looked at writer's badge and asked where is mine. She then began looking around her sink for her badge. Patient then walked with writer down the hall and then patient turned around and went back to her room and laid down.

## 2018-06-09 NOTE — ED Notes (Signed)
Hourly rounding reveals patient sleeping in room. No complaints, stable, in no acute distress. Q15 minute rounds and monitoring via Rover and Officer to continue.  

## 2018-06-09 NOTE — ED Notes (Signed)
Hourly rounding reveals patient in room. No complaints, stable, in no acute distress. Q15 minute rounds and monitoring via Rover and Officer to continue.   

## 2018-06-09 NOTE — ED Notes (Signed)
Patient waved staff to come in room, when writer went into room, attempted to feed patient her dinner, which consisted of meatloaf, broccoli, and mashed potatoes. Patient refused to eat any dinner but asked writer for some plain water. Writer took her a cup of ice water and patient took a few sips of water.

## 2018-06-09 NOTE — ED Notes (Signed)
Report to include Situation, Background, Assessment, and Recommendations received from [facility]. Alert, warm and dry, in no acute distress. Patient refuses to answer questions related to SI, HI, AVH and pain. Patient made aware of Q15 minute rounds and Psychologist, counselling presence for their safety. Patient instructed to come to me with needs or concerns.

## 2018-06-09 NOTE — ED Notes (Signed)
Pt  Vol  Pending  placement 

## 2018-06-09 NOTE — TOC Progression Note (Addendum)
Transition of Care [facility]) - Progression Note    Patient Details  Name: Jeanette Walter MRN: [id number] Date of Birth: [birth year]  Transition of Care [facility]) CM/SW Contact  Tania Elnore Cosens, LCSW Phone Number: 06/09/2018, 9:41 AM  Clinical Narrative:    9:27am  - CSW attempted to contact Ovidio Hanger at [facility] to ask for an update on when pt will be able to be admitted. Voicemail left.   9:28am - CSW contacted pt's sister [name] Shipp to see if she was able to send the required paperwork to [facility]. Myra shared she was able to hand deliver most of the paperwork on Saturday, except the consent form. Myra has some concerns regarding the form and wanted to consult with her lawyer before signing it. Myra stated that she is waiting to hear back from someone at [facility] to see if they need anything else from her.  CSW will follow-up.  11:15am - Ovidio Hanger at [facility] returned call to CSW. Danita stated that they are only waiting for pt's sister to sign the consent form. Danita stated that she will review pt's updated clinicals that were faxed on 06/06/18, in the meantime.   11:40am - Pt's DSS worker, Gus Puma, called to ask what "skill level" pt was. CSW provided her with an update regarding placement. Jon Gills shared that she will contact the pt's sister to see if she can talk to her regarding the form she is hesitant to sign.   2:29pm Pt's sister, [name], contacted CSW stating that she is waiting to speak with the administrator at [facility] to explain some of the terms on the consent form to her. Myra shared that she recently emailed someone regarding the form, and will keep CSW updated.   Expected Discharge Plan: Memory Care Barriers to Discharge: Insurance Authorization, Other (comment)(Medicaid pending)  Expected Discharge Plan and Services Expected Discharge Plan: Memory Care In-house Referral: Clinical Social Work Discharge Planning Services: CM Consult   Living arrangements for the  past 2 months: Assisted Living Facility([facility])                                       Social Determinants of Health (SDOH) Interventions    Readmission Risk Interventions No flowsheet data found.

## 2018-06-09 NOTE — ED Notes (Signed)
Pt given grape juice 

## 2018-06-09 NOTE — ED Notes (Signed)
Checked patients diaper she continued to be clean and dry

## 2018-06-09 NOTE — ED Notes (Signed)
Patient woke up and attempted to get out of bed, writer went into patients room and attempted to give her food, her tray consisted of breaded chicken, rice and gravy, and green peas. Patient held her tray and picked at the peas, picked up the chicken and took a small bite of it, and a couple of peas and then said take it away Im not hungry. Patient had about 4oz of Ensure with ice in a cup and she drank all of it.

## 2018-06-09 NOTE — ED Notes (Signed)
Patient asked where her sister [name] was. Writer asked patient if she wanted to call her and patient did not respond

## 2018-06-09 NOTE — ED Notes (Signed)
Patient checked for wet diaper. Diaper found dry.

## 2018-06-09 NOTE — ED Notes (Addendum)
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 Patient asked writer if she ever been there, and Clinical research associate said no, patient then became distracted and mumbling to herself.

## 2018-06-09 NOTE — ED Notes (Signed)
Patient came running out of her room and asked to go to the bathroom. Patient voided in the toilet with no distress

## 2018-06-09 NOTE — ED Notes (Signed)
Norvasc 5mg  and Atenolol 50mg  will be held per Dr. Mayford Knife

## 2018-06-09 NOTE — ED Notes (Signed)
Pt asleep, breakfast tray placed in rm.  

## 2018-06-09 NOTE — ED Notes (Signed)
Writer and nurse checked patients diaper, and she was dry and diaper was clean

## 2018-06-10 ENCOUNTER — Emergency Department: Payer: Medicare Other

## 2018-06-10 LAB — SARS CORONAVIRUS 2 BY RT PCR (HOSPITAL ORDER, PERFORMED IN ~~LOC~~ HOSPITAL LAB): SARS Coronavirus 2: NEGATIVE

## 2018-06-10 NOTE — ED Notes (Signed)
BEHAVIORAL HEALTH ROUNDING Patient sleeping: No. Patient alert : yes Behavior appropriate: pt rambling in and out of others rooms - walking into the hallway  - not easily redirected during the last hour   PRN med administered    ; If no, describe:  Nutrition and fluids offered: yes Toileting and hygiene offered: Yes  Sitter present: q15 minute observations and security monitoring Law enforcement present: Yes  ODS

## 2018-06-10 NOTE — ED Notes (Signed)

## 2018-06-10 NOTE — ED Notes (Signed)
BEHAVIORAL HEALTH ROUNDING Patient sleeping: Yes.   Patient alert and oriented: eyes closed  Appears asleep Behavior appropriate: Yes.  ; If no, describe:  Nutrition and fluids offered: Yes  Toileting and hygiene offered: sleeping Sitter present: q 15 minute observations and security monitoring Law enforcement present: yes  ODS 

## 2018-06-10 NOTE — ED Notes (Signed)
Patient dry at this time 

## 2018-06-10 NOTE — ED Notes (Signed)
Patient observed lying in bed with eyes closed  Even, unlabored respirations observed   NAD pt appears to be sleeping  I will continue to monitor along with every 15 minute visual observations and ongoing security monitoring    

## 2018-06-10 NOTE — ED Notes (Signed)
Hourly rounding reveals patient sleeping in room. No complaints, stable, in no acute distress. Q15 minute rounds and monitoring via Rover and Officer to continue.  

## 2018-06-10 NOTE — ED Notes (Signed)
Checked patient diaper again patient still dry offered water and lunch patient had half a cup of water and no food at this time.

## 2018-06-10 NOTE — ED Notes (Signed)
Gave food tray with juice. 

## 2018-06-10 NOTE — ED Provider Notes (Signed)
-----------------------------------------   1:48 AM on 06/10/2018 -----------------------------------------   Blood pressure (!) 115/56, pulse 72, temperature 98.2 F (36.8 C), temperature source Oral, resp. rate 18, weight 65.3 kg, SpO2 94 %.  The patient is calm and cooperative at this time.  There have been no acute events since the last update.  Awaiting disposition plan from Behavioral Medicine team.    Sharyn Creamer, MD 06/10/18 [phone]

## 2018-06-10 NOTE — ED Notes (Signed)
Pt given meal tray.

## 2018-06-10 NOTE — TOC Progression Note (Addendum)
Transition of Care [facility]) - Progression Note    Patient Details  Name: Jeanette Walter MRN: [id number] Date of Birth: [birth year]  Transition of Care [facility]) CM/SW Contact  Jeanette Jaylee Freeze, LCSW Phone Number: 06/10/2018, 1:27 PM  Clinical Narrative:    12:31pm - Danita at [facility] called CSW and shared that pt is "good to go." Danita stated that she will need updated FL2, list of all medications, TB test, and updated COVID test faxed to her.  Danita reported that pt should be able to be admitted to their facility tomorrow (06/11/18).  CSW notified EDP of current tests that are needed.  1:46pm - Pt's sister, Jeanette Walter, contacted CSW.CSW updated Myra, and shared that additional paperwork needed to be sent to [place] before pt can be transported there. CSW will keep pt's sister updated.   4:43pm - CSW faxed requested clinicals to Ovidio Hanger at [phone]  Expected Discharge Plan: Memory Care Barriers to Discharge: Insurance Authorization, Other (comment)(Medicaid pending)  Expected Discharge Plan and Services Expected Discharge Plan: Memory Care In-house Referral: Clinical Social Work Discharge Planning Services: CM Consult   Living arrangements for the past 2 months: Assisted Living Facility([facility])                                       Social Determinants of Health (SDOH) Interventions    Readmission Risk Interventions No flowsheet data found.

## 2018-06-10 NOTE — ED Notes (Signed)
Report to include Situation, Background, Assessment, and Recommendations received from [facility] RN. Patient alert, warm and dry, in no acute distress. Patient declines to discuss SI, HI, AVH and pain. Patient made aware of Q15 minute rounds and security cameras for their safety. Patient instructed to come to me with needs or concerns.

## 2018-06-10 NOTE — ED Notes (Signed)
Hourly rounding reveals patient in room. No complaints, stable, in no acute distress. Q15 minute rounds and monitoring via Rover and Officer to continue.   

## 2018-06-10 NOTE — ED Notes (Signed)
Pt assisted back to bed

## 2018-06-10 NOTE — ED Notes (Signed)
BEHAVIORAL HEALTH ROUNDING Patient sleeping: Yes.   Patient alert and oriented: eyes closed  Appears to be asleep Behavior appropriate: Yes.  ; If no, describe:  Nutrition and fluids offered: Yes  Toileting and hygiene offered: sleeping Sitter present: q 15 minute observations and security monitoring Law enforcement present: yes  ODS 

## 2018-06-10 NOTE — ED Notes (Signed)
VOL  PENDING  PLACEMENT 

## 2018-06-10 NOTE — ED Notes (Signed)
VOL/Pending Placement 

## 2018-06-10 NOTE — ED Notes (Signed)
Pt pulled down pants and urinated on blue chair in rm. Pt clothing and brief was changed. Bed linen changed. Pt in recliner eating meal.
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
## 2018-06-10 NOTE — ED Notes (Signed)
BEHAVIORAL HEALTH ROUNDING Patient sleeping: No. Patient alert : yes Behavior appropriate: Yes.  ; If no, describe:  Nutrition and fluids offered: yes Toileting and hygiene offered: Yes  Sitter present: q15 minute observations and security monitoring Law enforcement present: Yes  ODS  

## 2018-06-11 NOTE — ED Notes (Signed)
Patient discharged to facility in [place], ems transported per stretcher , Patient cooperative, no behavioral issues, v/s obtained and stable.

## 2018-06-11 NOTE — ED Notes (Signed)
Hourly rounding reveals patient in room. No complaints, stable, in no acute distress. Q15 minute rounds and monitoring via Rover and Officer to continue.   

## 2018-06-11 NOTE — ED Notes (Signed)
Report called to Bright at the [facility] in [place], [place].

## 2018-06-11 NOTE — TOC Transition Note (Signed)
Transition of Care [facility]) - CM/SW Discharge Note   Patient Details  Name: Jeanette Walter MRN: [id number] Date of Birth: 12-Apr-1945  Transition of Care [facility]) CM/SW Contact:  Cala Bradford, LCSW Phone Number: 06/11/2018, 12:45 PM   Clinical Narrative:    Danita from [facility] in [place], [place] contacted CSW and shared that pt can be admitted into the facility.  Bed: 110B  # to report: [phone]  Ask for Melissa Christmas  Ext [phone]  EDP and ED secretary notified.    Pt's sister, [name] was notified of transfer to facility.  CSW attempted to contact pt's social worker, Gus Puma. Voicemail was left.   Final next level of care: Memory Care Barriers to Discharge: Barriers Resolved   Patient Goals and CMS Choice   CMS Medicare.gov Compare Post Acute Care list provided to:: Legal Guardian Choice offered to / list presented to : [facility] POA / Guardian(Myra Shipp )  Discharge Placement                Patient to be transferred to facility by: [facility] Name of family member notified: [name] ;  [phone] Patient and family notified of of transfer: 06/11/18  Discharge Plan and Services In-house Referral: Clinical Social Work Discharge Planning Services: CM Consult                                 Social Determinants of Health (SDOH) Interventions     Readmission Risk Interventions No flowsheet data found.

## 2018-06-11 NOTE — ED Notes (Signed)
Hourly rounding reveals patient sleeping in room. No complaints, stable, in no acute distress. Q15 minute rounds and monitoring via Rover and Officer to continue.  

## 2018-06-11 NOTE — ED Notes (Signed)
Pt's brief changed by staff with 2 person assist.  [place] ointment applied.

## 2018-06-11 NOTE — ED Notes (Signed)
Checked patient , patient dry at this time.

## 2018-06-11 NOTE — TOC Progression Note (Addendum)
Transition of Care [facility]) - Progression Note    Patient Details  Name: Jeanette Walter MRN: [id number] Date of Birth: [birth year]  Transition of Care [facility]) CM/SW Contact  Tania Ysabela Keisler, LCSW Phone Number: 06/11/2018, 9:46 AM  Clinical Narrative:    9:45am - CSW attempted to contact Ovidio Hanger at [facility] to see if she received fax that was sent yesterday and to see if chest x-ray would suffice for TB test. Voicemail left.   11:11am - Danita from [facility] contacted CSW and shared that pt can be admitted into the facility.  Bed: 110B  # to report: [phone]  Ask for Melissa Christmas  Ext [phone]  EDP notified.    CSW notified Caryn Bee with EMS. Caryn Bee stated that he will speak with his boss concerning the transport and will return call to CSW.   11:47am- Caryn Bee with EMS stated that they will be able to transport pt after lunch.   ED secretary, Drinda Butts, was notified.   11:50am - CSW attempted to contact pt's sister to update her. Voicemail was left.   12:27pm- CSW attempted again to contact pt's sister, [name]. She did not pick up.   Expected Discharge Plan: Memory Care Barriers to Discharge: Insurance Authorization, Other (comment)(Medicaid pending)  Expected Discharge Plan and Services Expected Discharge Plan: Memory Care In-house Referral: Clinical Social Work Discharge Planning Services: CM Consult   Living arrangements for the past 2 months: Assisted Living Facility([facility])                                       Social Determinants of Health (SDOH) Interventions    Readmission Risk Interventions No flowsheet data found.

## 2018-06-11 NOTE — ED Provider Notes (Signed)
-----------------------------------------   7:18 AM on 06/11/2018 -----------------------------------------   Blood pressure 112/69, pulse 95, temperature 98.3 F (36.8 C), temperature source Oral, resp. rate 16, weight 65.3 kg, SpO2 95 %.  The patient is calm and cooperative at this time.  There have been no acute events since the last update.  Awaiting disposition plan from Behavioral Medicine team.    Jeanmarie Plant, MD 06/11/18 [phone]

## 2018-08-23 DEATH — deceased
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
